# Patient Record
Sex: Female | Born: 1985 | Race: White | Hispanic: No | Marital: Married | Smoking: Never smoker
Health system: Southern US, Community
[De-identification: ages and names within clinical notes are randomized; demographics above are authoritative.]

## PROBLEM LIST (undated history)

## (undated) DIAGNOSIS — R519 Headache, unspecified: Secondary | ICD-10-CM

## (undated) DIAGNOSIS — K8020 Calculus of gallbladder without cholecystitis without obstruction: Secondary | ICD-10-CM

## (undated) DIAGNOSIS — J45909 Unspecified asthma, uncomplicated: Secondary | ICD-10-CM

## (undated) DIAGNOSIS — D649 Anemia, unspecified: Secondary | ICD-10-CM

## (undated) DIAGNOSIS — R51 Headache: Secondary | ICD-10-CM

## (undated) DIAGNOSIS — F32A Depression, unspecified: Secondary | ICD-10-CM

## (undated) DIAGNOSIS — I499 Cardiac arrhythmia, unspecified: Secondary | ICD-10-CM

## (undated) DIAGNOSIS — K219 Gastro-esophageal reflux disease without esophagitis: Secondary | ICD-10-CM

## (undated) DIAGNOSIS — F419 Anxiety disorder, unspecified: Secondary | ICD-10-CM

## (undated) HISTORY — PX: TONSILLECTOMY: SUR1361

## (undated) HISTORY — PX: MANDIBLE SURGERY: SHX707

## (undated) HISTORY — DX: Depression, unspecified: F32.A

---
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
## 2006-08-17 ENCOUNTER — Emergency Department: Payer: Self-pay | Admitting: Emergency Medicine

## 2006-08-19 ENCOUNTER — Ambulatory Visit: Payer: Self-pay | Admitting: Emergency Medicine

## 2006-08-20 ENCOUNTER — Emergency Department: Payer: Self-pay | Admitting: Emergency Medicine

## 2007-02-14 ENCOUNTER — Emergency Department: Payer: Self-pay | Admitting: Emergency Medicine

## 2007-07-12 ENCOUNTER — Emergency Department: Payer: Self-pay | Admitting: Emergency Medicine

## 2008-01-19 ENCOUNTER — Observation Stay: Payer: Self-pay

## 2008-01-23 ENCOUNTER — Observation Stay: Payer: Self-pay | Admitting: Obstetrics and Gynecology

## 2008-01-27 ENCOUNTER — Inpatient Hospital Stay: Payer: Self-pay | Admitting: Obstetrics and Gynecology

## 2008-03-01 ENCOUNTER — Emergency Department: Payer: Self-pay | Admitting: Emergency Medicine

## 2008-11-24 ENCOUNTER — Ambulatory Visit: Payer: Self-pay | Admitting: Obstetrics and Gynecology

## 2009-02-02 ENCOUNTER — Observation Stay: Payer: Self-pay | Admitting: Obstetrics and Gynecology

## 2009-03-09 ENCOUNTER — Observation Stay: Payer: Self-pay | Admitting: Obstetrics and Gynecology

## 2009-03-16 ENCOUNTER — Observation Stay: Payer: Self-pay | Admitting: Obstetrics and Gynecology

## 2009-03-17 ENCOUNTER — Observation Stay: Payer: Self-pay | Admitting: Obstetrics and Gynecology

## 2009-03-19 ENCOUNTER — Observation Stay: Payer: Self-pay

## 2009-03-23 ENCOUNTER — Observation Stay: Payer: Self-pay | Admitting: Obstetrics and Gynecology

## 2009-03-31 ENCOUNTER — Inpatient Hospital Stay: Payer: Self-pay | Admitting: Obstetrics and Gynecology

## 2009-04-08 ENCOUNTER — Emergency Department: Payer: Self-pay | Admitting: Emergency Medicine

## 2010-01-17 IMAGING — CT CT HEAD WITHOUT CONTRAST
2 series · 16 of 30 positions shown, 20 images · non-contrast
Comparison: none

REASON FOR EXAM: headache
COMMENTS:

[Series 2: without · axial · non-contrast · 0.39mm/px · z∈[+496,+616]mm · 13 of 28 slices shown, 17 images]
[im 2/28  brain]
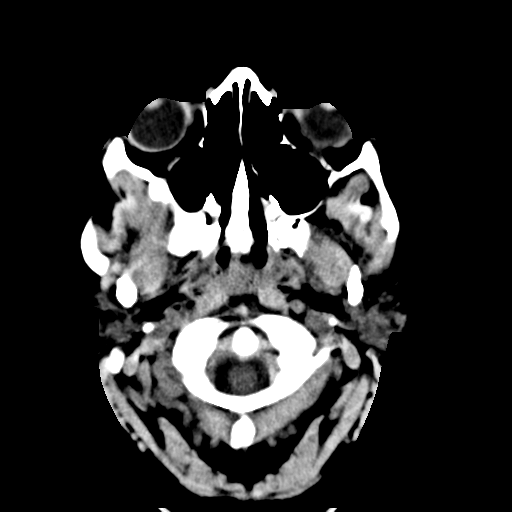
[im 2/28  bone]
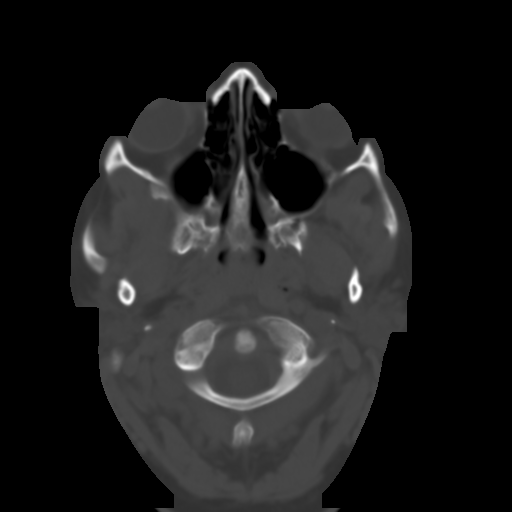
[im 4/28  brain]
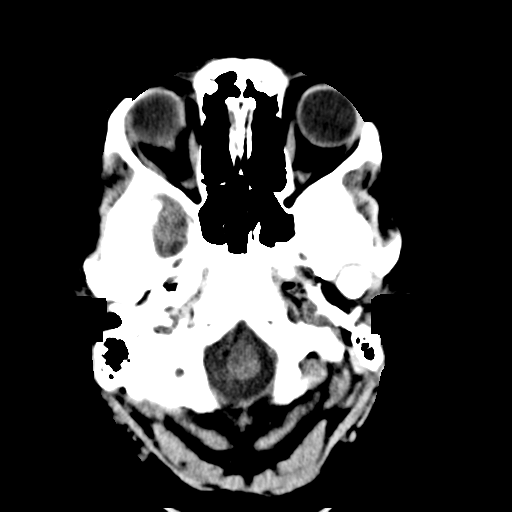
[im 6/28  brain]
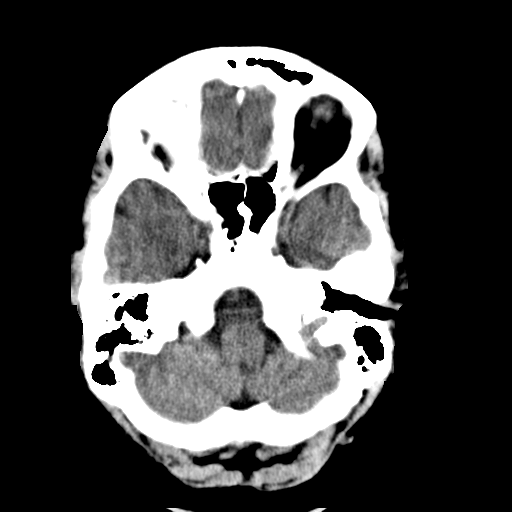
[im 8/28  brain]
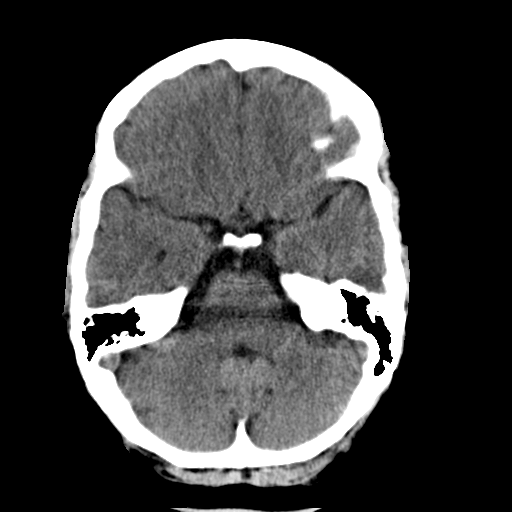
[im 10/28  brain]
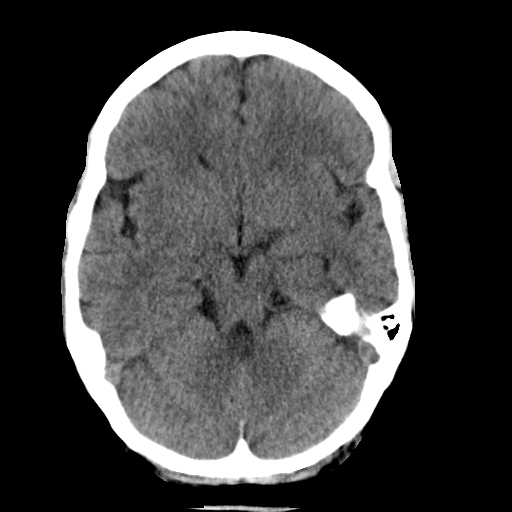
[im 10/28  bone]
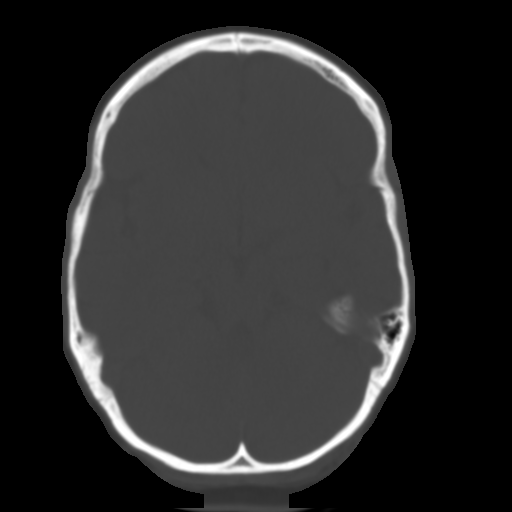
[im 12/28  brain]
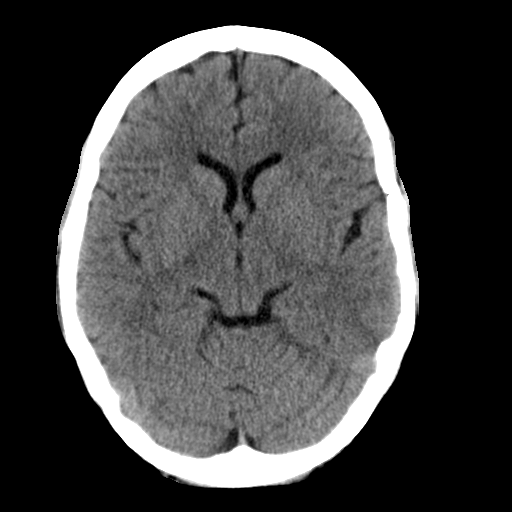
[im 14/28  brain]
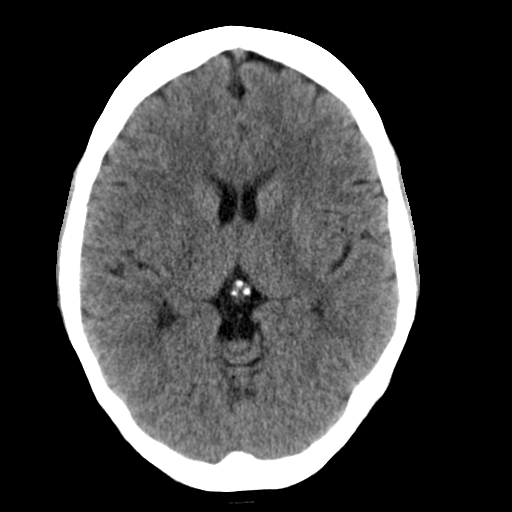
[im 16/28  brain]
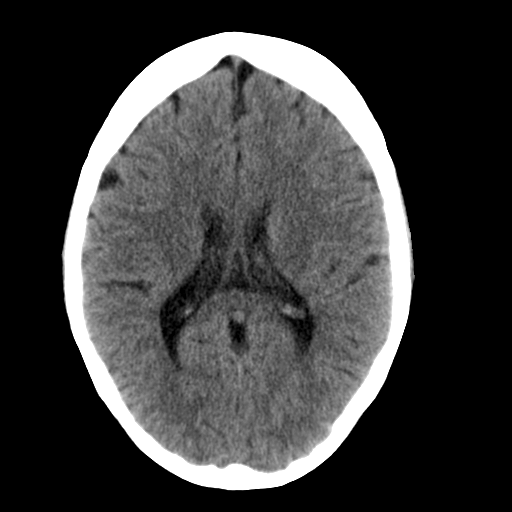
[im 18/28  brain]
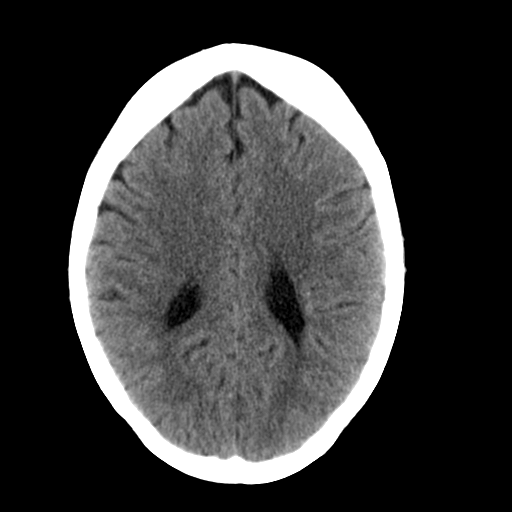
[im 18/28  bone]
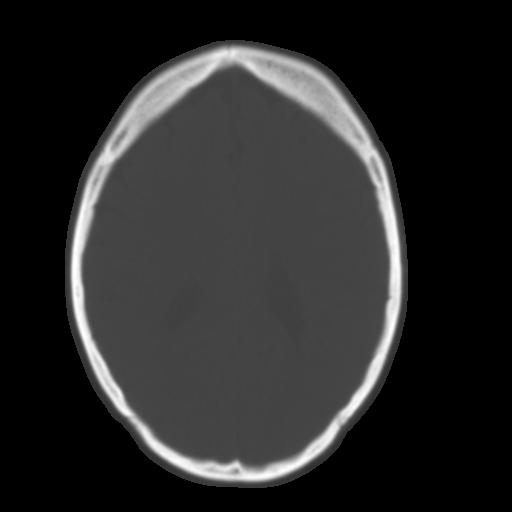
[im 20/28  brain]
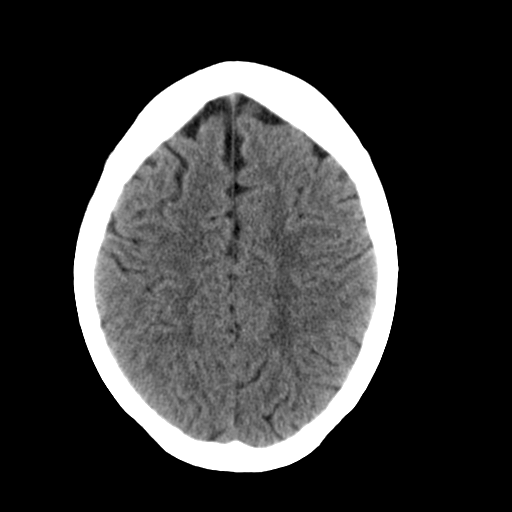
[im 22/28  brain]
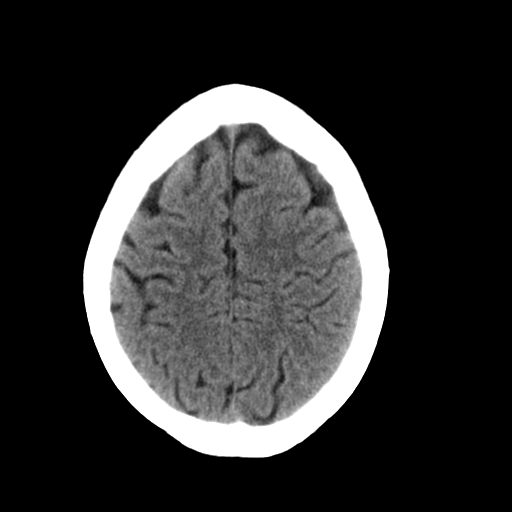
[im 24/28  brain]
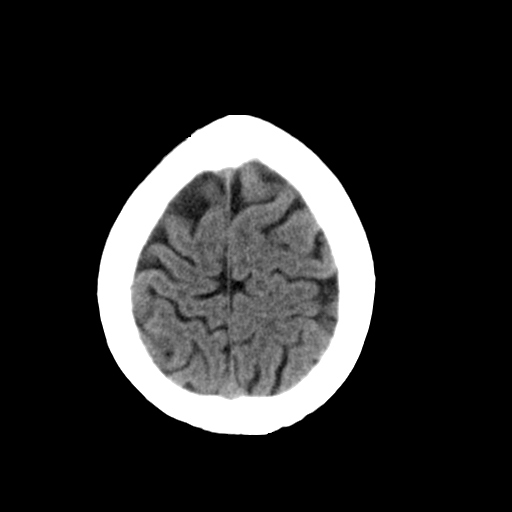
[im 26/28  brain]
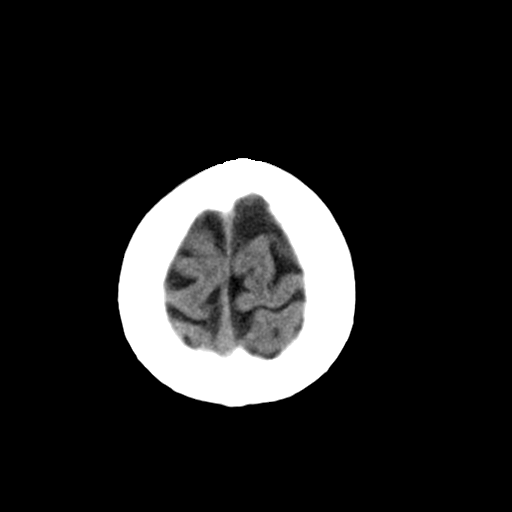
[im 26/28  bone]
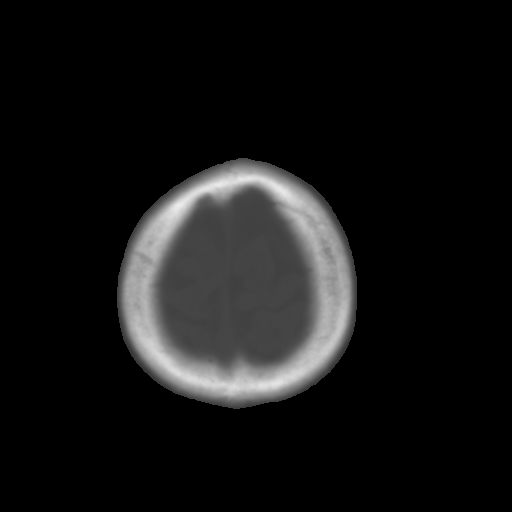

[Series 3: bone · axial · 0.39mm/px · z∈[+496,+536]mm · 3 of 28 slices shown]
[im 2/28  bone]
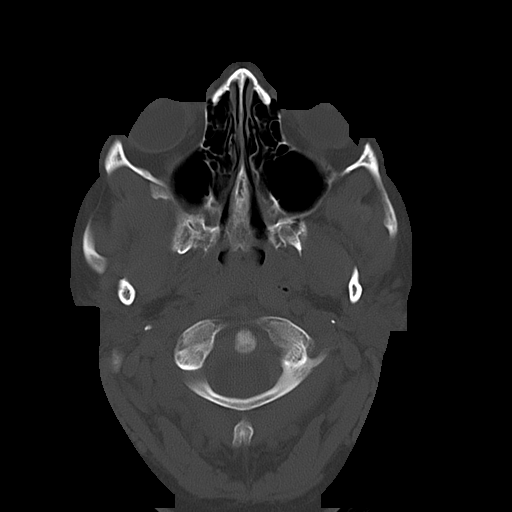
[im 6/28  bone]
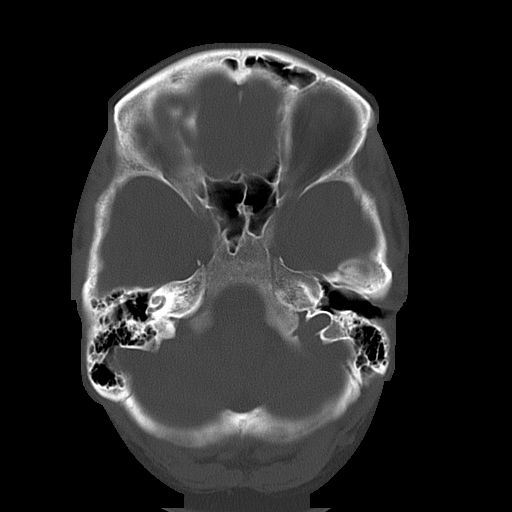
[im 10/28  bone]
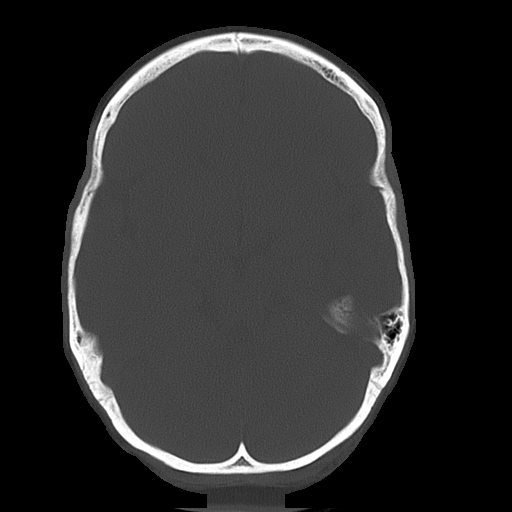

[16 of 30 positions shown; findings below may reference images not displayed]

PROCEDURE:     CT  - CT HEAD WITHOUT CONTRAST  - April 08, 2009  [DATE]

RESULT:     History: Headache, recent epidural.

Comparison studies: No recent.

Procedure and Findings: Standard nonenhanced head CT obtained. No
intra-axial or extra-axial pathologic fluid or blood collections identified.
No mass lesion noted. No hydrocephalus noted, lucencies noted in the left
temporal region appear to represent prominent sulci. If symptoms persist MRI
is suggested. No acute bony abnormality identified.
IMPRESSION: No acute abnormality identified. Please see discussion
above.

## 2011-11-20 ENCOUNTER — Observation Stay: Payer: Self-pay | Admitting: Obstetrics and Gynecology

## 2011-11-20 LAB — URINALYSIS, COMPLETE
Bilirubin,UR: NEGATIVE
Blood: NEGATIVE
Glucose,UR: NEGATIVE mg/dL (ref 0–75)
Hyaline Cast: 2
Ketone: NEGATIVE
Nitrite: NEGATIVE
Ph: 6 (ref 4.5–8.0)
Protein: NEGATIVE
RBC,UR: 2 /HPF (ref 0–5)
Specific Gravity: 1.016 (ref 1.003–1.030)
Squamous Epithelial: 14
WBC UR: 9 /HPF (ref 0–5)

## 2011-11-20 LAB — FETAL FIBRONECTIN
Appearance: NORMAL
Fetal Fibronectin: NEGATIVE

## 2011-11-30 ENCOUNTER — Inpatient Hospital Stay: Payer: Self-pay

## 2011-11-30 LAB — CBC WITH DIFFERENTIAL/PLATELET
Basophil #: 0 10*3/uL (ref 0.0–0.1)
Basophil %: 0.2 %
Eosinophil #: 0.1 10*3/uL (ref 0.0–0.7)
Eosinophil %: 1.1 %
HCT: 30.9 % — ABNORMAL LOW (ref 35.0–47.0)
HGB: 10 g/dL — ABNORMAL LOW (ref 12.0–16.0)
Lymphocyte #: 3 10*3/uL (ref 1.0–3.6)
Lymphocyte %: 23.5 %
MCH: 22.6 pg — ABNORMAL LOW (ref 26.0–34.0)
MCHC: 32.5 g/dL (ref 32.0–36.0)
MCV: 70 fL — ABNORMAL LOW (ref 80–100)
Monocyte #: 1.1 10*3/uL — ABNORMAL HIGH (ref 0.0–0.7)
Monocyte %: 8.6 %
Neutrophil #: 8.4 10*3/uL — ABNORMAL HIGH (ref 1.4–6.5)
Neutrophil %: 66.6 %
Platelet: 264 10*3/uL (ref 150–440)
RBC: 4.43 10*6/uL (ref 3.80–5.20)
RDW: 16.3 % — ABNORMAL HIGH (ref 11.5–14.5)
WBC: 12.6 10*3/uL — ABNORMAL HIGH (ref 3.6–11.0)

## 2011-12-02 LAB — HEMATOCRIT: HCT: 25.8 % — ABNORMAL LOW (ref 35.0–47.0)

## 2011-12-07 ENCOUNTER — Emergency Department: Payer: Self-pay | Admitting: Emergency Medicine

## 2011-12-07 LAB — COMPREHENSIVE METABOLIC PANEL
Albumin: 3.2 g/dL — ABNORMAL LOW (ref 3.4–5.0)
Alkaline Phosphatase: 90 U/L (ref 50–136)
Anion Gap: 10 (ref 7–16)
BUN: 8 mg/dL (ref 7–18)
Bilirubin,Total: 0.3 mg/dL (ref 0.2–1.0)
Calcium, Total: 9 mg/dL (ref 8.5–10.1)
Chloride: 108 mmol/L — ABNORMAL HIGH (ref 98–107)
Co2: 25 mmol/L (ref 21–32)
Creatinine: 0.66 mg/dL (ref 0.60–1.30)
EGFR (African American): >60
EGFR (Non-African Amer.): >60
Glucose: 83 mg/dL (ref 65–99)
Osmolality: 282 (ref 275–301)
Potassium: 3.8 mmol/L (ref 3.5–5.1)
SGOT(AST): 16 U/L (ref 15–37)
SGPT (ALT): 24 U/L
Sodium: 143 mmol/L (ref 136–145)
Total Protein: 7.6 g/dL (ref 6.4–8.2)

## 2011-12-07 LAB — CBC
HCT: 35.2 % (ref 35.0–47.0)
HGB: 11.2 g/dL — ABNORMAL LOW (ref 12.0–16.0)
MCH: 23 pg — ABNORMAL LOW (ref 26.0–34.0)
MCHC: 31.9 g/dL — ABNORMAL LOW (ref 32.0–36.0)
MCV: 72 fL — ABNORMAL LOW (ref 80–100)
Platelet: 308 10*3/uL (ref 150–440)
RBC: 4.89 10*6/uL (ref 3.80–5.20)
RDW: 18.1 % — ABNORMAL HIGH (ref 11.5–14.5)
WBC: 9.6 10*3/uL (ref 3.6–11.0)

## 2011-12-07 LAB — CK TOTAL AND CKMB (NOT AT ARMC)
CK, Total: 33 U/L (ref 21–215)
CK-MB: <0.5 ng/mL — ABNORMAL LOW (ref 0.5–3.6)

## 2011-12-07 LAB — TROPONIN I: Troponin-I: <0.02 ng/mL

## 2012-01-08 ENCOUNTER — Emergency Department: Payer: Self-pay | Admitting: Emergency Medicine

## 2012-01-08 LAB — URINALYSIS, COMPLETE
Bacteria: NONE SEEN
Bilirubin,UR: NEGATIVE
Glucose,UR: NEGATIVE mg/dL (ref 0–75)
Nitrite: NEGATIVE
Ph: 7 (ref 4.5–8.0)
Protein: NEGATIVE
RBC,UR: 1 /HPF (ref 0–5)
Specific Gravity: 1.014 (ref 1.003–1.030)
Squamous Epithelial: 12
WBC UR: 25 /HPF (ref 0–5)

## 2012-09-16 IMAGING — CR DG CHEST 2V
1 series · 2 of 2 positions shown · non-contrast
Comparison: none

REASON FOR EXAM: Shortness of Breath
COMMENTS:   May transport without cardiac monitor

PROCEDURE:     DXR - DXR CHEST PA (OR AP) AND LATERAL  - December 07, 2011 [DATE]
RESULT:     The lungs are clear. The heart and pulmonary vessels are normal.
The bony and mediastinal structures are unremarkable. There is no effusion.
There is no pneumothorax or evidence of congestive failure.

[Series 1: w chest pa · 0.14mm/px · 2 of 2 slices shown]
[im 1/2]
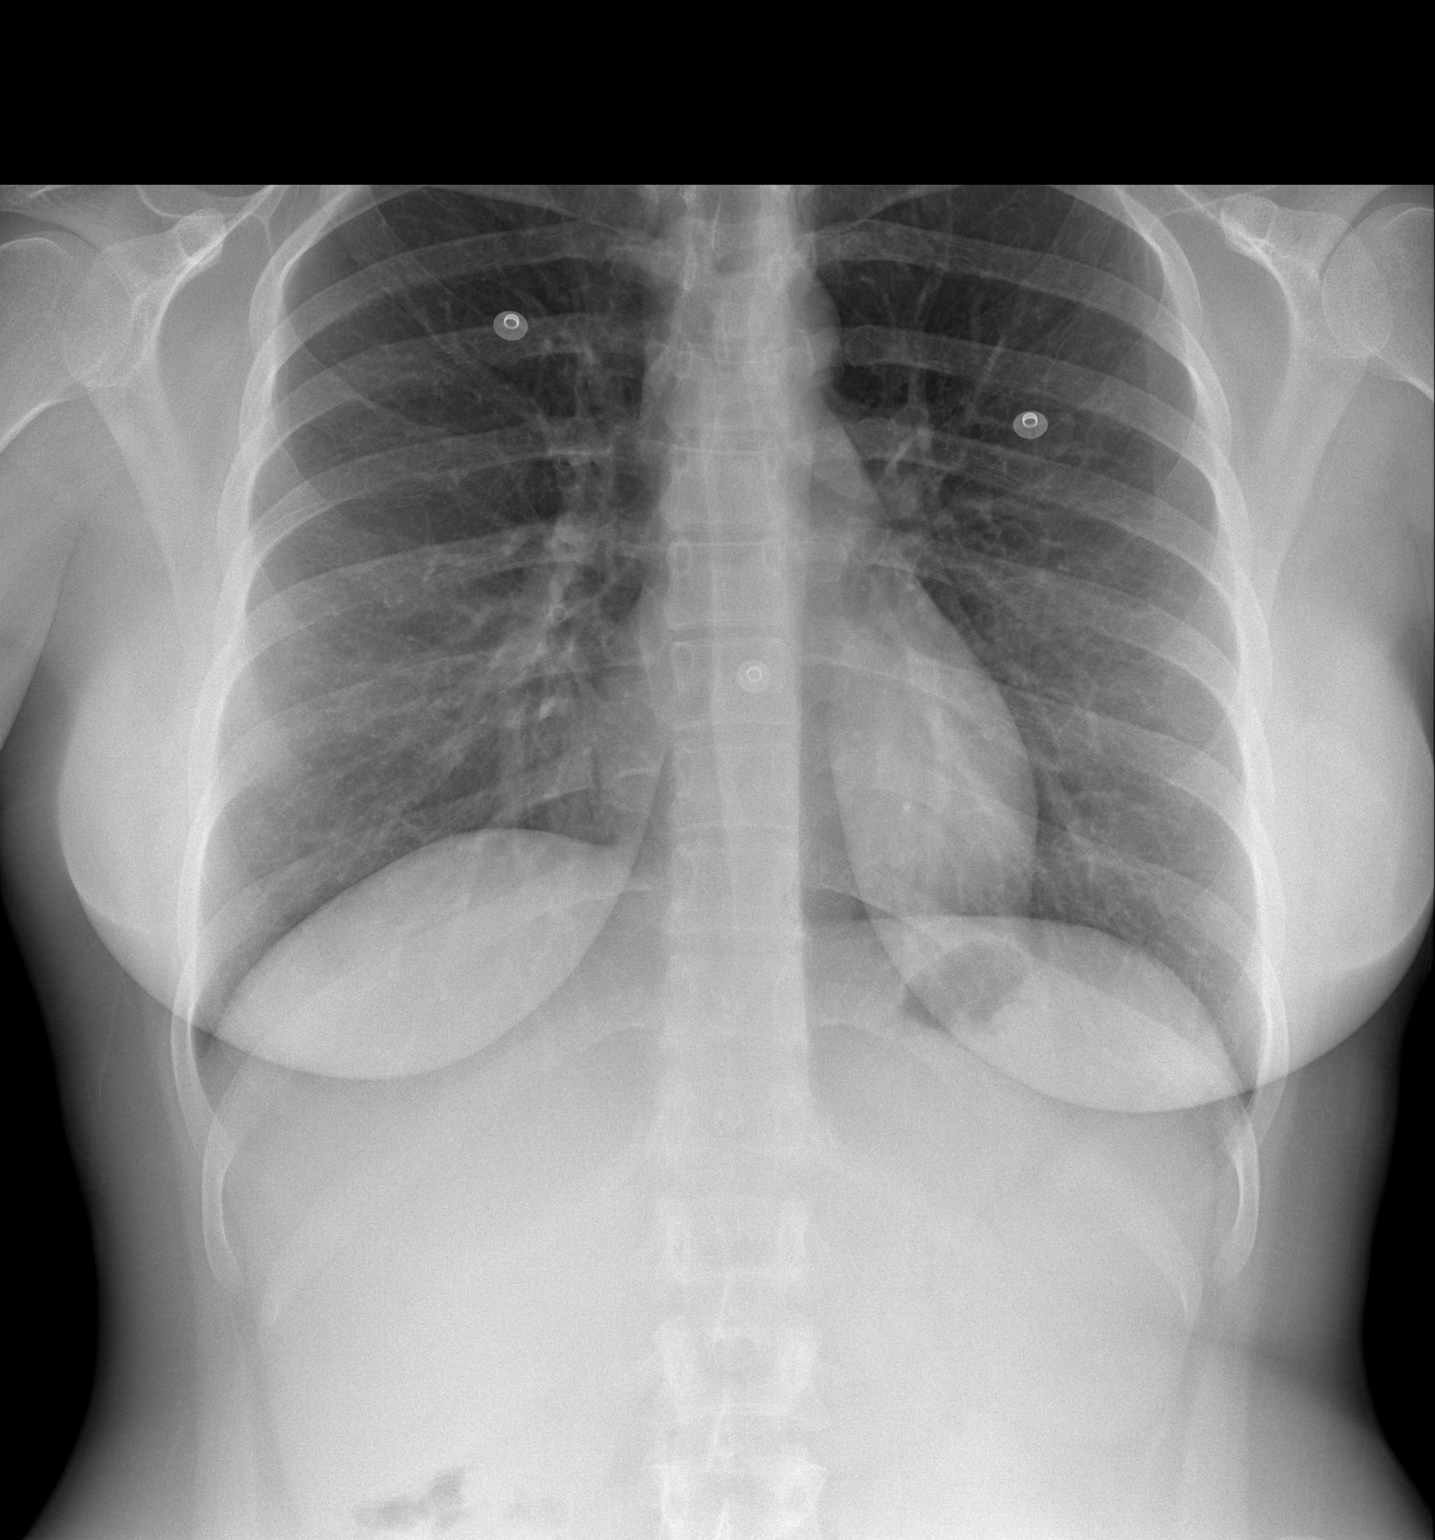
[im 2/2]
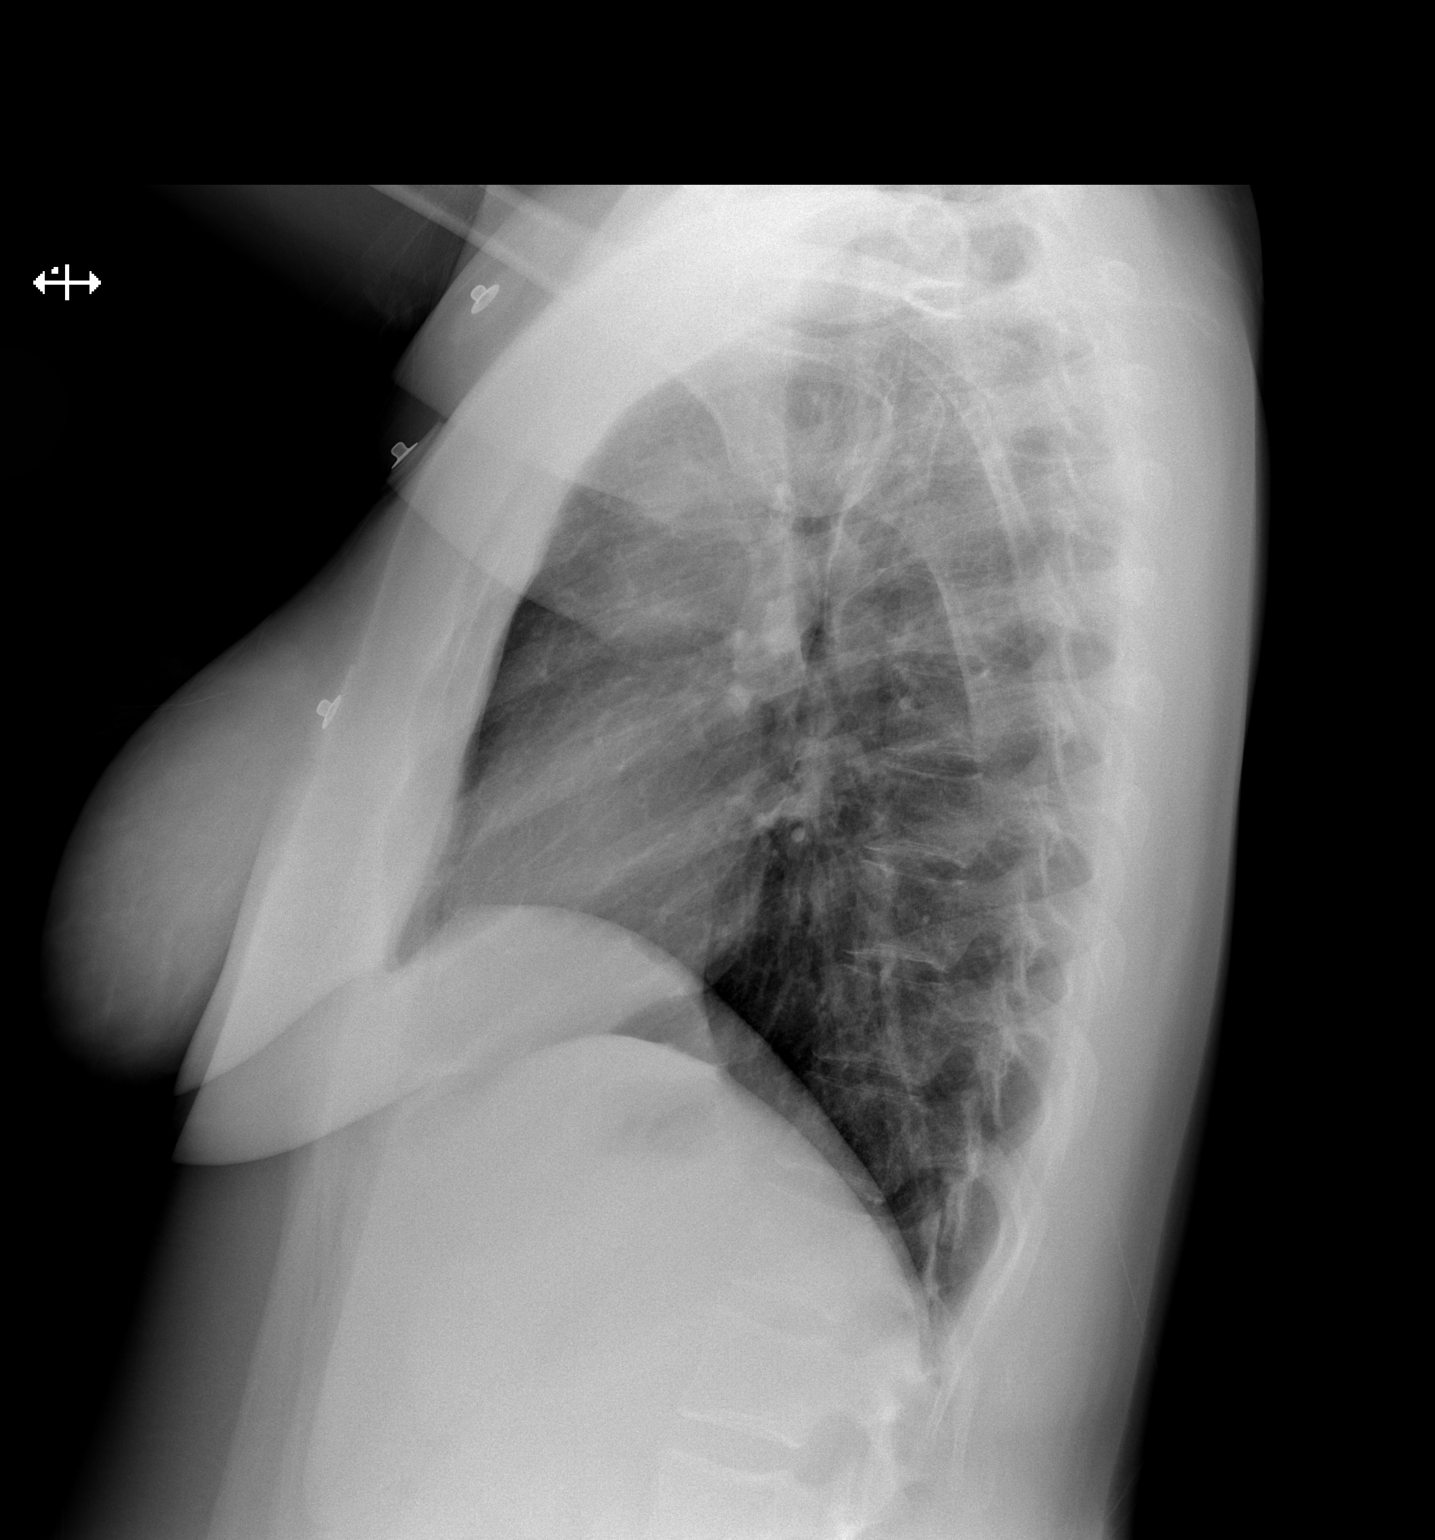

[2 of 2 positions shown; findings below may reference images not displayed]

IMPRESSION: No acute cardiopulmonary disease.

## 2012-09-16 IMAGING — CT CT CHEST W/ CM
1 series · 16 of 34 positions shown, 20 images · IV contrast (APPLIED)
Comparison: none

REASON FOR EXAM: dyspnea post partum
COMMENTS:

PROCEDURE:     CT  - CT CHEST (FOR PE) W  - December 07, 2011  [DATE]
RESULT:     Chest CT dated 12/07/2011.
TECHNIQUE: Helical 3 mm sections were obtained the thoracic inlet the lung
bases status post intravenous ministration of 100 mL misread 370.

[Series 5: soft tissue · axial · 0.69mm/px · z∈[-350,-76]mm · 16 of 103 slices shown, 20 images]
[im 8/103  mediastinal]
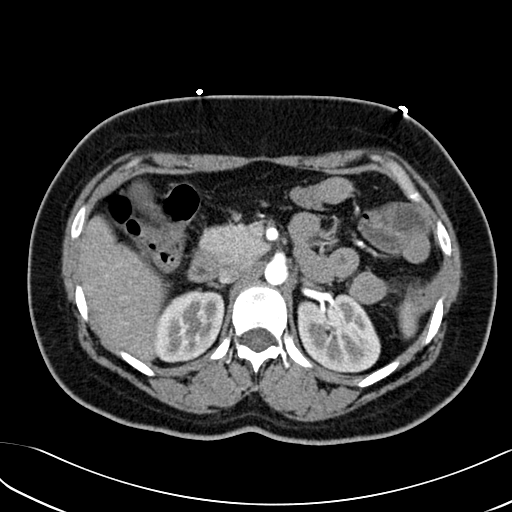
[im 8/103  lung]
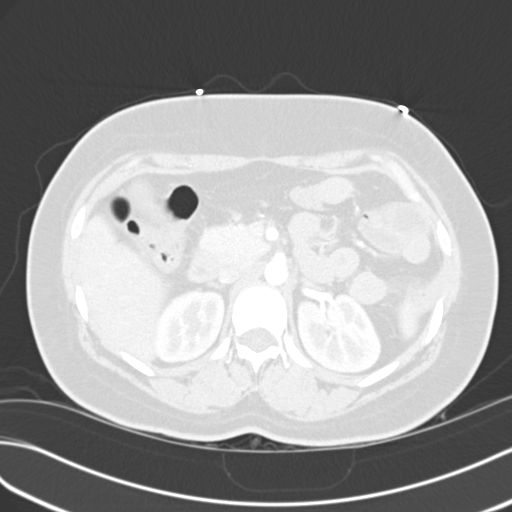
[im 16/103  lung]
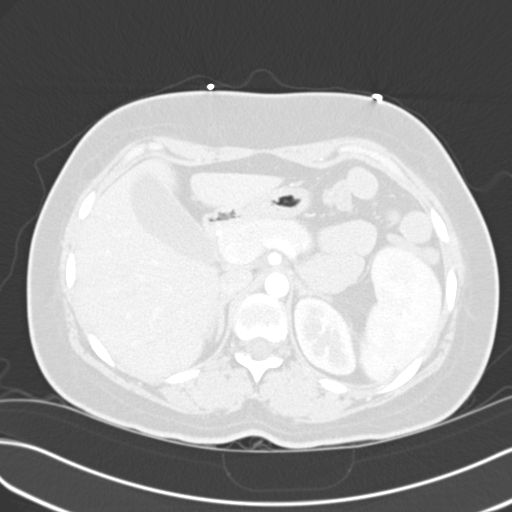
[im 21/103  lung]
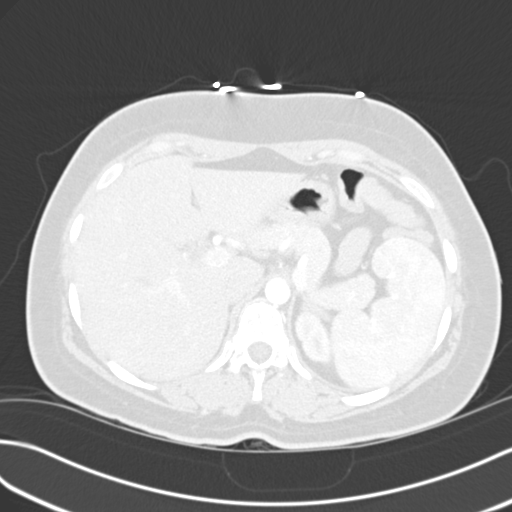
[im 27/103  lung]
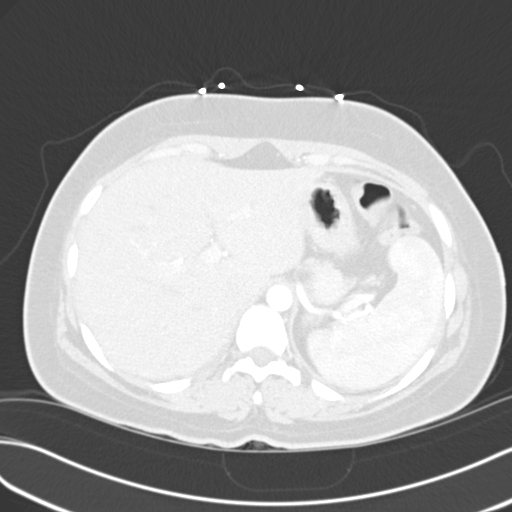
[im 35/103  mediastinal]
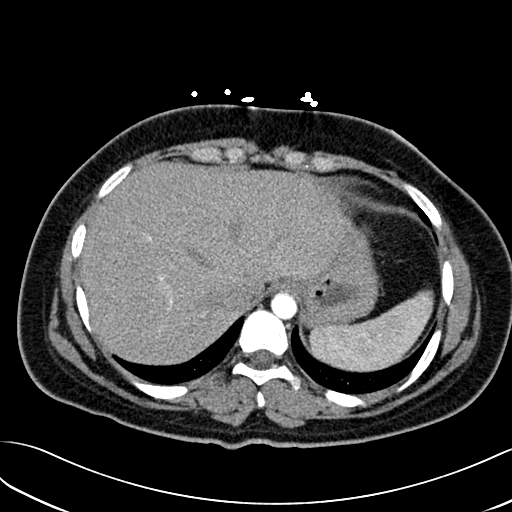
[im 35/103  lung]
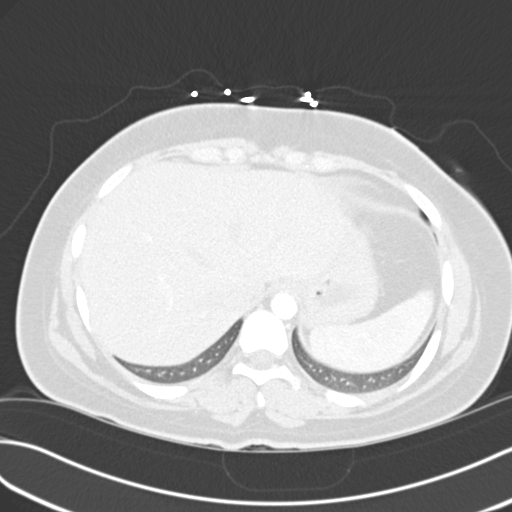
[im 41/103  lung]
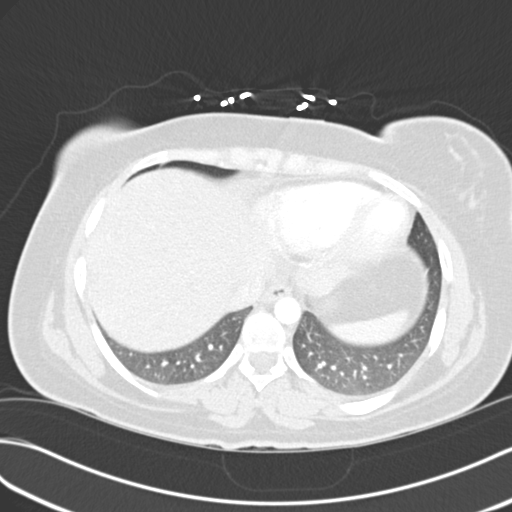
[im 46/103  lung]
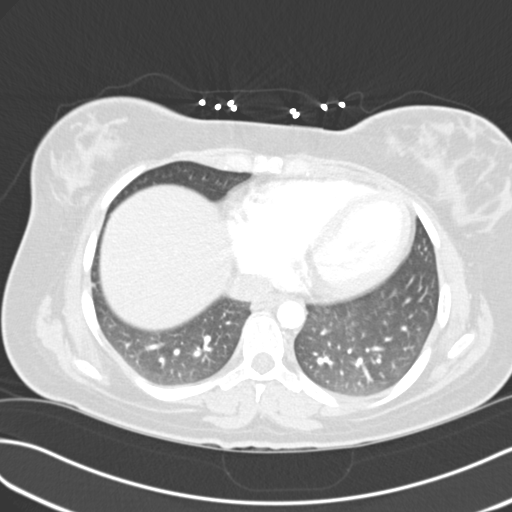
[im 50/103  lung]
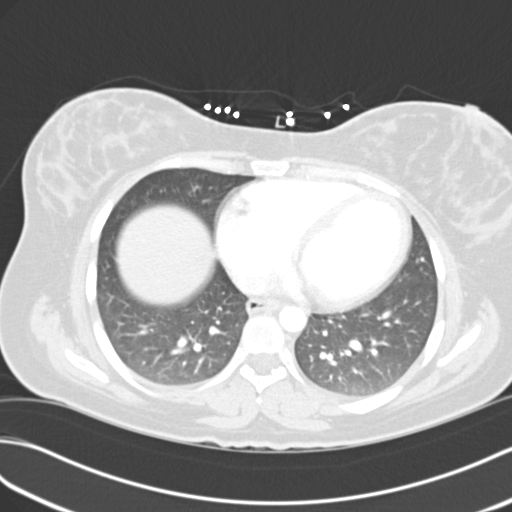
[im 55/103  mediastinal]
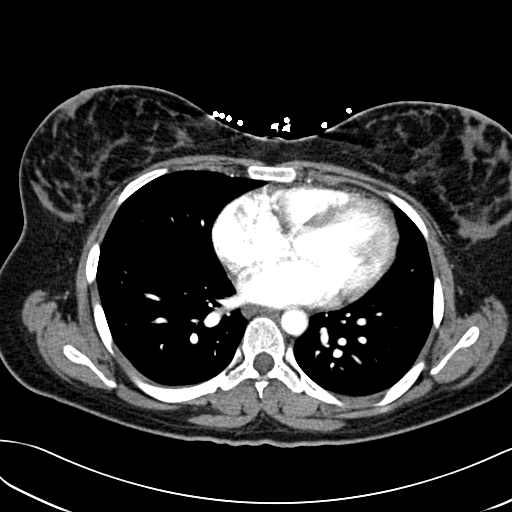
[im 55/103  lung]
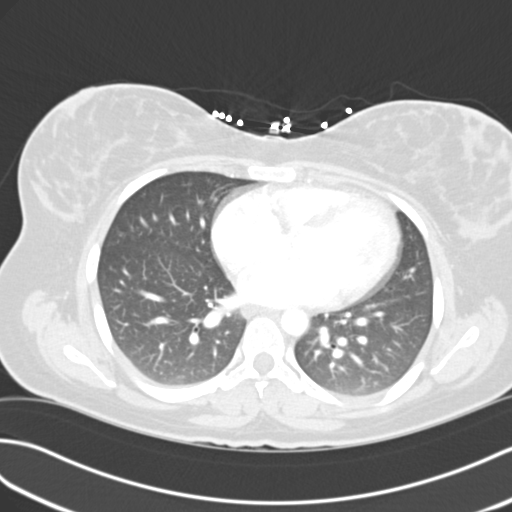
[im 61/103  lung]
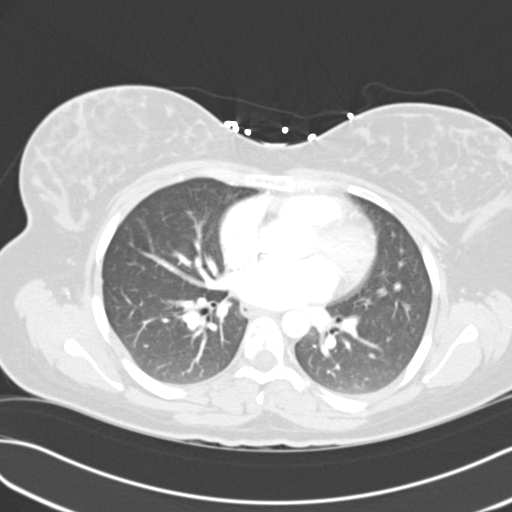
[im 65/103  lung]
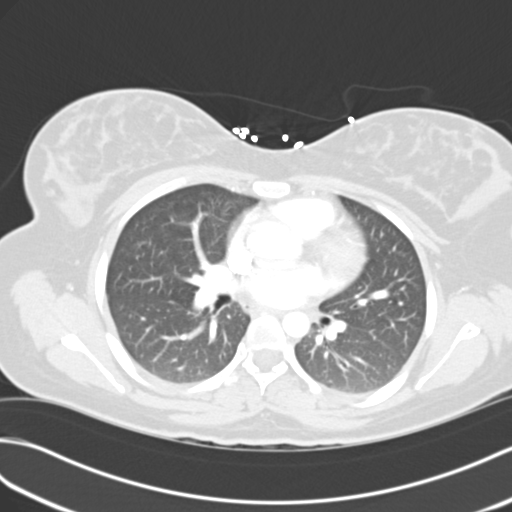
[im 72/103  lung]
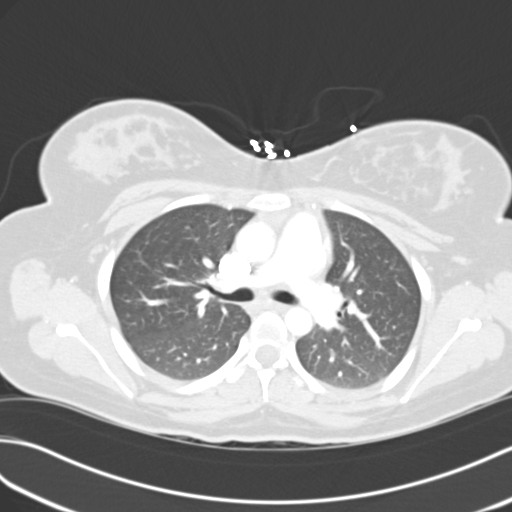
[im 80/103  mediastinal]
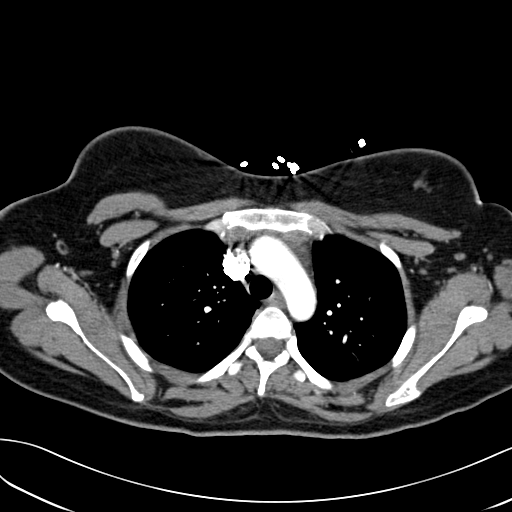
[im 80/103  lung]
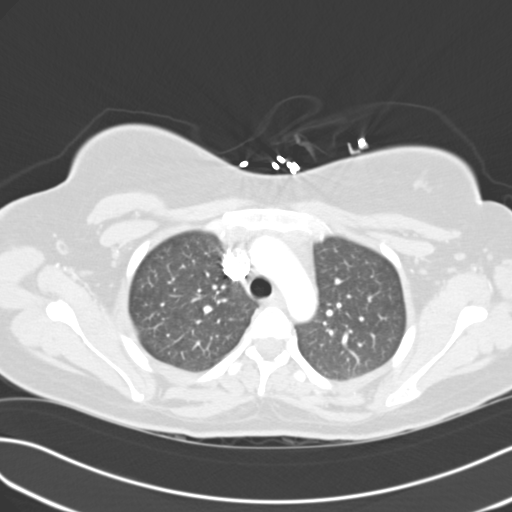
[im 84/103  lung]
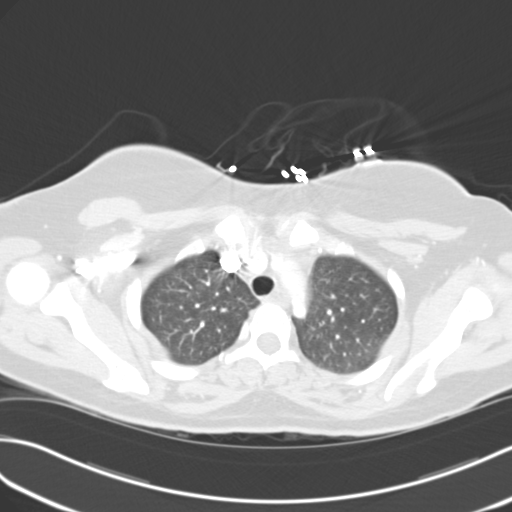
[im 91/103  lung]
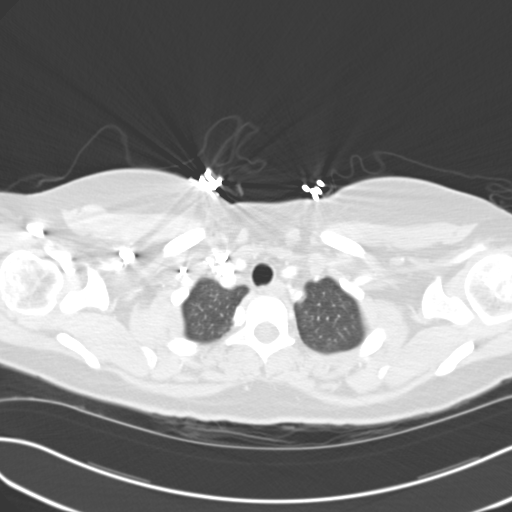
[im 99/103  lung]
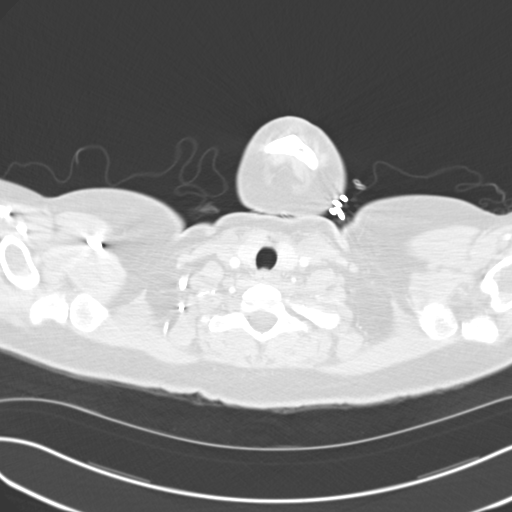

[16 of 34 positions shown; findings below may reference images not displayed]

FINDINGS: Evaluation of the mediastinum and hilar regions and structures
demonstrates no evidence of mediastinal or hilar adenopathy nor masses.
There is no evidence of filling defects within the main, lobar, or segmental
pulmonary arteries. The lung parenchyma demonstrates no evidence of focal
infiltrates, effusions, edema, masses, or nodules. The visualized upper
abdominal viscera demonstrate no gross abnormalities.
IMPRESSION: Chest CT without evidence of focal acute abnormalities.
2. There is no CT evidence of pulmonary arterial embolic disease.

## 2012-09-24 ENCOUNTER — Emergency Department: Payer: Self-pay | Admitting: Unknown Physician Specialty

## 2012-09-24 LAB — URINALYSIS, COMPLETE
Bilirubin,UR: NEGATIVE
Blood: NEGATIVE
Glucose,UR: NEGATIVE mg/dL (ref 0–75)
Ketone: NEGATIVE
Nitrite: NEGATIVE
Ph: 5 (ref 4.5–8.0)
Protein: NEGATIVE
RBC,UR: 2 /HPF (ref 0–5)
Specific Gravity: 1.03 (ref 1.003–1.030)
Squamous Epithelial: 32
WBC UR: 5 /HPF (ref 0–5)

## 2012-09-24 LAB — WET PREP, GENITAL

## 2013-06-29 ENCOUNTER — Emergency Department: Payer: Self-pay | Admitting: Unknown Physician Specialty

## 2014-02-22 ENCOUNTER — Emergency Department: Payer: Self-pay | Admitting: Emergency Medicine

## 2015-03-15 NOTE — H&P (Signed)
L&D Evaluation:  History:   HPI 29 y/o [id number] [facility]EDC 01/02/12 [redacted]W[redacted]D    Presents with contractions    Patient's Medical History No Chronic Illness    Patient's Surgical History none    Medications Pre Natal Vitamins    Allergies NKDA    Social History none    Family History Non-Contributory   ROS:   ROS All systems were reviewed.  HEENT, CNS, GI, GU, Respiratory, CV, Renal and Musculoskeletal systems were found to be normal.   Exam:   Vital Signs stable    General no apparent distress    Abdomen gravid, non-tender    Estimated Fetal Weight Average for gestational age    Back no CVAT    Edema no edema    Reflexes 1+    Pelvic cervix closed and thick, post  No presenting part in pelvis    Mebranes Intact    FHT normal rate with no decels    Ucx regular    Ucx Frequency 3 min    Ucx Pain Scale 2   Impression:   Impression contractions   Plan:   Plan EFM/NST, fluids    Comments fFN sent -- pt had sex last pm; may increase false + rate Contractions improving with IVF bolus If fFN -, will d/c home with F/U 1 week If fFN+, [facility] (given likelihood of false +)   Electronic Signatures: Margaretha GlassingEvans, Ricky L (MD)  (Signed 15-Jan-13 14:00)  Authored: L&D Evaluation   Last Updated: 15-Jan-13 14:00 by Margaretha GlassingEvans, Ricky L (MD)

## 2015-03-15 NOTE — H&P (Signed)
L&D Evaluation:  History:   HPI 29 y/o [id number] @ 36wks EDC 01/02/12 arrives with c/o contractions beginning this am, small bloody show, denies leaking fluid, baby is active. Care @ [facility] well pregnancy, HX rapid deliveries 37/38wks, anemia hgb 10/07/11-10.0.GBS unknown.    Presents with contractions    Patient's Medical History No Chronic Illness    Patient's Surgical History none    Medications Pre Natal Vitamins    Allergies NKDA    Social History none    Family History Non-Contributory   ROS:   ROS All systems were reviewed.  HEENT, CNS, GI, GU, Respiratory, CV, Renal and Musculoskeletal systems were found to be normal.   Exam:   Vital Signs stable    Urine Protein not completed    General no apparent distress    Mental Status clear    Chest clear    Heart normal sinus rhythm    Abdomen gravid, non-tender    Estimated Fetal Weight Average for gestational age    Fetal Position vtx    Fundal Height appropriate    Back no CVAT    Reflexes 1+    Clonus negative    Pelvic no external lesions, 3cm 50% bloody show BBOW vtx @ -2    Mebranes Intact    FHT normal rate with no decels, 120's baseline avg variability with accels    FHT Description 126    Ucx regular, q 2/3 mins 45/60 sec moderate    Skin dry    Lymph no lymphadenopathy   Impression:   Impression early labor, PTL   Plan:   Comments Will begin IV fluid bolus 500cc then 250cc/hr IV ABX begun. Breathing through [facility] well, may request epidural with progress. Knows what to expect 3rd baby. Discussed preterm status delivery at 36 weeks.FOB supportive at bedside.   Electronic Signatures: Albertina ParrLugiano, Adriyanna Christians B (CNM)  (Signed 25-Jan-13 14:20)  Authored: L&D Evaluation   Last Updated: 25-Jan-13 14:20 by Albertina ParrLugiano, Danaja Lasota B (CNM)

## 2015-06-27 DIAGNOSIS — K8020 Calculus of gallbladder without cholecystitis without obstruction: Secondary | ICD-10-CM | POA: Diagnosis not present

## 2015-06-27 DIAGNOSIS — R1013 Epigastric pain: Secondary | ICD-10-CM | POA: Diagnosis present

## 2015-06-27 DIAGNOSIS — Z3202 Encounter for pregnancy test, result negative: Secondary | ICD-10-CM | POA: Insufficient documentation

## 2015-06-28 ENCOUNTER — Emergency Department: Payer: BLUE CROSS/BLUE SHIELD

## 2015-06-28 ENCOUNTER — Encounter: Payer: Self-pay | Admitting: Emergency Medicine

## 2015-06-28 ENCOUNTER — Emergency Department
Admission: EM | Admit: 2015-06-28 | Discharge: 2015-06-28 | Disposition: A | Discharge status: Home / Self Care | Payer: BLUE CROSS/BLUE SHIELD | Attending: Emergency Medicine | Admitting: Emergency Medicine

## 2015-06-28 DIAGNOSIS — K8020 Calculus of gallbladder without cholecystitis without obstruction: Secondary | ICD-10-CM

## 2015-06-28 DIAGNOSIS — R1011 Right upper quadrant pain: Secondary | ICD-10-CM

## 2015-06-28 HISTORY — DX: Calculus of gallbladder without cholecystitis without obstruction: K80.20

## 2015-06-28 HISTORY — DX: Anxiety disorder, unspecified: F41.9

## 2015-06-28 LAB — URINALYSIS COMPLETE WITH MICROSCOPIC (ARMC ONLY)
Bacteria, UA: NONE SEEN
Bilirubin Urine: NEGATIVE
Glucose, UA: NEGATIVE mg/dL
Hgb urine dipstick: NEGATIVE
Ketones, ur: NEGATIVE mg/dL
Nitrite: NEGATIVE
Protein, ur: NEGATIVE mg/dL
Specific Gravity, Urine: 1.016 (ref 1.005–1.030)
pH: 6 (ref 5.0–8.0)

## 2015-06-28 LAB — CBC
HCT: 35.5 % (ref 35.0–47.0)
Hemoglobin: 11.5 g/dL — ABNORMAL LOW (ref 12.0–16.0)
MCH: 24.1 pg — ABNORMAL LOW (ref 26.0–34.0)
MCHC: 32.3 g/dL (ref 32.0–36.0)
MCV: 74.5 fL — ABNORMAL LOW (ref 80.0–100.0)
Platelets: 297 10*3/uL (ref 150–440)
RBC: 4.76 MIL/uL (ref 3.80–5.20)
RDW: 14.3 % (ref 11.5–14.5)
WBC: 10.3 10*3/uL (ref 3.6–11.0)

## 2015-06-28 LAB — COMPREHENSIVE METABOLIC PANEL
ALT: 26 U/L (ref 14–54)
AST: 33 U/L (ref 15–41)
Albumin: 4.3 g/dL (ref 3.5–5.0)
Alkaline Phosphatase: 46 U/L (ref 38–126)
Anion gap: 7 (ref 5–15)
BUN: 8 mg/dL (ref 6–20)
CO2: 25 mmol/L (ref 22–32)
Calcium: 10.1 mg/dL (ref 8.9–10.3)
Chloride: 106 mmol/L (ref 101–111)
Creatinine, Ser: 0.54 mg/dL (ref 0.44–1.00)
GFR calc Af Amer: >60 mL/min (ref >60)
GFR calc non Af Amer: >60 mL/min (ref >60)
Glucose, Bld: 111 mg/dL — ABNORMAL HIGH (ref 65–99)
Potassium: 3.5 mmol/L (ref 3.5–5.1)
Sodium: 138 mmol/L (ref 135–145)
Total Bilirubin: 0.1 mg/dL — ABNORMAL LOW (ref 0.3–1.2)
Total Protein: 7.7 g/dL (ref 6.5–8.1)

## 2015-06-28 LAB — LIPASE, BLOOD: Lipase: 54 U/L — ABNORMAL HIGH (ref 22–51)

## 2015-06-28 LAB — POCT PREGNANCY, URINE: Preg Test, Ur: NEGATIVE

## 2015-06-28 MED ORDER — ONDANSETRON 4 MG PO TBDP: 4.0000 mg | ORAL_TABLET | Freq: Three times a day (TID) | ORAL | Status: DC | PRN

## 2015-06-28 MED ORDER — OXYCODONE-ACETAMINOPHEN 5-325 MG PO TABS: 1.0000 | ORAL_TABLET | ORAL | Status: DC | PRN

## 2015-06-28 MED ORDER — GI COCKTAIL ~~LOC~~
30.0000 mL | Freq: Once | ORAL | Status: AC
  Administered 2015-06-28: 30 mL via ORAL
  Filled 2015-06-28: qty 30

## 2015-06-28 NOTE — ED Provider Notes (Signed)
[facility] Emergency Department Provider Note  ____________________________________________  Time seen: 1:30 AM  I have reviewed the triage vital signs and the nursing notes.   HISTORY  Chief Complaint Abdominal Pain     HPI Miranda Gordon is a 29 y.o. female presents with epigastric abdominal pain that radiates to her back/right upper quadrant. Patient denies fever no nausea or vomiting or diarrhea.     Past Medical History  Diagnosis Date  . Anxiety     There are no active problems to display for this patient.   Past Surgical History  Procedure Laterality Date  . Mandible surgery      No current outpatient prescriptions on file.  Allergies Review of patient's allergies indicates no known allergies.  No family history on file.  Social History Social History  Substance Use Topics  . Smoking status: Never Smoker   . Smokeless tobacco: None  . Alcohol Use: No    Review of Systems  Constitutional: Negative for fever. Eyes: Negative for visual changes. ENT: Negative for sore throat. Cardiovascular: Negative for chest pain. Respiratory: Negative for shortness of breath. Gastrointestinal: Positive for abdominal pain, negative for vomiting and diarrhea. Genitourinary: Negative for dysuria. Musculoskeletal: Negative for back pain. Skin: Negative for rash. Neurological: Negative for headaches, focal weakness or numbness.   10-point ROS otherwise negative.  ____________________________________________   PHYSICAL EXAM:  VITAL SIGNS: ED Triage Vitals  Enc Vitals Group     BP 06/28/15 0013 115/101 mmHg     Pulse Rate 06/28/15 0013 92     Resp 06/28/15 0013 20     Temp 06/28/15 0013 98.1 F (36.7 C)     Temp Source 06/28/15 0013 Oral     SpO2 06/28/15 0013 99 %     Weight 06/28/15 0013 150 lb (68.04 kg)     Height 06/28/15 0013  (1.549 m)     Head Cir --      Peak Flow --      Pain Score 06/28/15 0013 7     Pain Loc  --      Pain Edu? --      Excl. in GC? --      Constitutional: Alert and oriented. Well appearing and in no distress. Eyes: Conjunctivae are normal. PERRL. Normal extraocular movements. ENT   Head: Normocephalic and atraumatic.   Nose: No congestion/rhinnorhea.   Mouth/Throat: Mucous membranes are moist.   Neck: No stridor. Cardiovascular: Normal rate, regular rhythm. Normal and symmetric distal pulses are present in all extremities. No murmurs, rubs, or gallops. Respiratory: Normal respiratory effort without tachypnea nor retractions. Breath sounds are clear and equal bilaterally. No wheezes/rales/rhonchi. Gastrointestinal: Right upper quadrant/epigastric pain with palpation. No distention. There is no CVA tenderness. Genitourinary: deferred Musculoskeletal: Nontender with normal range of motion in all extremities. No joint effusions.  No lower extremity tenderness nor edema. Neurologic:  Normal speech and language. No gross focal neurologic deficits are appreciated. Speech is normal.  Skin:  Skin is warm, dry and intact. No rash noted. Psychiatric: Mood and affect are normal. Speech and behavior are normal. Patient exhibits appropriate insight and judgment.  ____________________________________________    LABS (pertinent positives/negatives)  Labs Reviewed  LIPASE, BLOOD - Abnormal; Notable for the following:    Lipase 54 (*)    All other components within normal limits  COMPREHENSIVE METABOLIC PANEL - Abnormal; Notable for the following:    Glucose, Bld 111 (*)    Total Bilirubin 0.1 (*)  All other components within normal limits  CBC - Abnormal; Notable for the following:    Hemoglobin 11.5 (*)    MCV 74.5 (*)    MCH 24.1 (*)    All other components within normal limits  URINALYSIS COMPLETEWITH MICROSCOPIC ([facility] ONLY) - Abnormal; Notable for the following:    Color, Urine YELLOW (*)    APPearance CLEAR (*)    Leukocytes, UA 3+ (*)    Squamous Epithelial  / LPF 6-30 (*)    All other components within normal limits  POC URINE PREG, ED  POCT PREGNANCY, URINE       RADIOLOGY  US Abdomen Limited RUQ (Final result) Result time: 06/28/15 02:30:41   Procedure changed from US Abdomen Limited      Final result by Rad Results In Interface (06/28/15 02:30:41)   Narrative:   CLINICAL DATA: Right upper quadrant and epigastric pain for 2 days.  EXAM: US ABDOMEN LIMITED - RIGHT UPPER QUADRANT  COMPARISON: None.  FINDINGS: Gallbladder:  Cholelithiasis with mobile shadowing stone at the gallbladder neck measuring 1.5 cm. Gallbladder is mildly distended. No wall thickening visualized, wall thickness of 1.7 mm. Sonographic Murphy sign is positive.  Common bile duct:  Diameter: 2.5 mm, normal  Liver:  No focal lesion identified. Within normal limits in parenchymal echogenicity.  IMPRESSION: Mobile gallstone with mild gallbladder distention but no wall thickening. In the setting of positive sonographic Murphy sign, acute cholecystitis is considered. Nuclear medicine hepatobiliary scan could be considered for further evaluation based on clinical concern. No biliary dilatation.   Electronically Signed By: Rubye Oaks M.D. On: 06/28/2015 02:30     INITIAL IMPRESSION / ASSESSMENT AND PLAN / ED COURSE  Pertinent labs & imaging results that were available during my care of the patient were reviewed by me and considered in my medical decision making (see chart for details).    ____________________________________________   FINAL CLINICAL IMPRESSION(S) / ED DIAGNOSES  Final diagnoses:  Calculus of gallbladder without cholecystitis without obstruction      Darci Current, MD 06/28/15 [phone]

## 2015-06-28 NOTE — ED Notes (Signed)
Patient ambulatory to triage with steady gait, without difficulty or distress noted; pt reports mid abd pain radiating into back; denies accomp symptoms, denies hx of same

## 2015-06-28 NOTE — Discharge Instructions (Signed)
Cholelithiasis °Cholelithiasis (also called gallstones) is a form of gallbladder disease in which gallstones form in your gallbladder. The gallbladder is an organ that stores bile made in the liver, which helps digest fats. Gallstones begin as small crystals and slowly grow into stones. Gallstone pain occurs when the gallbladder spasms and a gallstone is blocking the duct. Pain can also occur when a stone passes out of the duct.  °RISK FACTORS °· Being female.   °· Having multiple pregnancies. Health care providers sometimes advise removing diseased gallbladders before future pregnancies.   °· Being obese. °· Eating a diet heavy in fried foods and fat.   °· Being older than 60 years and increasing age.   °· Prolonged use of medicines containing female hormones.   °· Having diabetes mellitus.   °· Rapidly losing weight.   °· Having a family history of gallstones (heredity).   °SYMPTOMS °· Nausea.   °· Vomiting. °· Abdominal pain.   °· Yellowing of the skin (jaundice).   °· Sudden pain. It may persist from several minutes to several hours. °· Fever.   °· Tenderness to the touch.  °In some cases, when gallstones do not move into the bile duct, people have no pain or symptoms. These are called "silent" gallstones.  °TREATMENT °Silent gallstones do not need treatment. In severe cases, emergency surgery may be required. Options for treatment include: °· Surgery to remove the gallbladder. This is the most common treatment. °· Medicines. These do not always work and may take 6-12 months or more to work. °· Shock wave treatment (extracorporeal biliary lithotripsy). In this treatment an ultrasound machine sends shock waves to the gallbladder to break gallstones into smaller pieces that can pass into the intestines or be dissolved by medicine. °HOME CARE INSTRUCTIONS  °· Only take over-the-counter or prescription medicines for pain, discomfort, or fever as directed by your health care provider.   °· Follow a low-fat diet until  seen again by your health care provider. Fat causes the gallbladder to contract, which can result in pain.   °· Follow up with your health care provider as directed. Attacks are almost always recurrent and surgery is usually required for permanent treatment.   °SEEK IMMEDIATE MEDICAL CARE IF:  °· Your pain increases and is not controlled by medicines.   °· You have a fever or persistent symptoms for more than 2-3 days.   °· You have a fever and your symptoms suddenly get worse.   °· You have persistent nausea and vomiting.   °MAKE SURE YOU:  °· Understand these instructions. °· Will watch your condition. °· Will get help right away if you are not doing well or get worse. °Document Released: 10/18/2005 Document Revised: 06/24/2013 Document Reviewed: 04/15/2013 °Exit[employer]® Patient Information ©2015 [employer]. This information is not intended to replace advice given to you by your health care provider. Make sure you discuss any questions you have with your health care provider. ° °

## 2015-06-29 ENCOUNTER — Encounter: Payer: Self-pay | Admitting: Surgery

## 2015-06-29 ENCOUNTER — Ambulatory Visit (INDEPENDENT_AMBULATORY_CARE_PROVIDER_SITE_OTHER): Payer: BLUE CROSS/BLUE SHIELD | Admitting: Surgery

## 2015-06-29 VITALS — BP 124/68 | HR 78 | Temp 98.5°F | Ht 61.0 in | Wt 152.0 lb

## 2015-06-29 DIAGNOSIS — K8050 Calculus of bile duct without cholangitis or cholecystitis without obstruction: Secondary | ICD-10-CM | POA: Insufficient documentation

## 2015-06-29 DIAGNOSIS — K8020 Calculus of gallbladder without cholecystitis without obstruction: Secondary | ICD-10-CM

## 2015-06-29 NOTE — Progress Notes (Signed)
Patient ID: Miranda Gordon, female   DOB: 04/26/1986, 28 y.o.   MRN: [id number]  Chief Complaint  Patient presents with  . Cholelithiasis    surgical consult    HPI Location, Quality, Duration, Severity, Timing, Context, Modifying Factors, Associated Signs and Symptoms.  Miranda Gordon is a 28 y.o. female.  Who is otherwise healthy referred by the emergency room following the sudden onset of right upper quadrant and epigastric abdominal pain following eating pizza associated with nausea but no emesis. The patient had a workup with normal liver function tests and lipase and ultrasound demonstrated a 1.5 cm gallstone which was mobile. No B evidence of biliary ductal dilatation pericholecystic fluid or gallbladder wall thickening. The patient does relate a 3-4 month history of what she describes as heartburn associated with intermittent right upper quadrant abdominal pain following meals.  Past Medical History  Diagnosis Date  . Anxiety   . Cholelithiasis     Past Surgical History  Procedure Laterality Date  . Mandible surgery      Family History  Problem Relation Age of Onset  . Cancer Father     Social History Social History  Substance Use Topics  . Smoking status: Never Smoker   . Smokeless tobacco: Never Used  . Alcohol Use: No    No Known Allergies  Current Outpatient Prescriptions  Medication Sig Dispense Refill  . ondansetron (ZOFRAN ODT) 4 MG disintegrating tablet Take 1 tablet (4 mg total) by mouth every 8 (eight) hours as needed for nausea or vomiting. 20 tablet 0  . oxyCODONE-acetaminophen (ROXICET) 5-325 MG per tablet Take 1 tablet by mouth every 4 (four) hours as needed for severe pain. 20 tablet 0   No current facility-administered medications for this visit.    Blood pressure 124/68, pulse 78, temperature 98.5 F (36.9 C), temperature source Oral, height 5' 1" (1.549 m), weight 152 lb (68.947 kg), last menstrual period 06/13/2015.  Results for orders  placed or performed during the hospital encounter of 06/28/15 (from the past 48 hour(s))  Urinalysis complete, with microscopic ([facility] only)     Status: Abnormal   Collection Time: 06/28/15 12:16 AM  Result Value Ref Range   Color, Urine YELLOW (A) YELLOW   APPearance CLEAR (A) CLEAR   Glucose, UA NEGATIVE NEGATIVE mg/dL   Bilirubin Urine NEGATIVE NEGATIVE   Ketones, ur NEGATIVE NEGATIVE mg/dL   Specific Gravity, Urine 1.016 1.005 - 1.030   Hgb urine dipstick NEGATIVE NEGATIVE   pH 6.0 5.0 - 8.0   Protein, ur NEGATIVE NEGATIVE mg/dL   Nitrite NEGATIVE NEGATIVE   Leukocytes, UA 3+ (A) NEGATIVE   RBC / HPF 0-5 0 - 5 RBC/hpf   WBC, UA 0-5 0 - 5 WBC/hpf   Bacteria, UA NONE SEEN NONE SEEN   Squamous Epithelial / LPF 6-30 (A) NONE SEEN   Mucous PRESENT   Lipase, blood     Status: Abnormal   Collection Time: 06/28/15 12:19 AM  Result Value Ref Range   Lipase 54 (H) 22 - 51 U/L  Comprehensive metabolic panel     Status: Abnormal   Collection Time: 06/28/15 12:19 AM  Result Value Ref Range   Sodium 138 135 - 145 mmol/L   Potassium 3.5 3.5 - 5.1 mmol/L   Chloride 106 101 - 111 mmol/L   CO2 25 22 - 32 mmol/L   Glucose, Bld 111 (H) 65 - 99 mg/dL   BUN 8 6 - 20 mg/dL   Creatinine, Ser 0.54 0.44 -   1.00 mg/dL   Calcium 10.1 8.9 - 10.3 mg/dL   Total Protein 7.7 6.5 - 8.1 g/dL   Albumin 4.3 3.5 - 5.0 g/dL   AST 33 15 - 41 U/L   ALT 26 14 - 54 U/L   Alkaline Phosphatase 46 38 - 126 U/L   Total Bilirubin 0.1 (L) 0.3 - 1.2 mg/dL   GFR calc non Af Amer >60 >60 mL/min   GFR calc Af Amer >60 >60 mL/min    Comment: (NOTE) The eGFR has been calculated using the CKD EPI equation. This calculation has not been validated in all clinical situations. eGFR's persistently <60 mL/min signify possible Chronic Kidney Disease.    Anion gap 7 5 - 15  CBC     Status: Abnormal   Collection Time: 06/28/15 12:19 AM  Result Value Ref Range   WBC 10.3 3.6 - 11.0 K/uL   RBC 4.76 3.80 - 5.20 MIL/uL    Hemoglobin 11.5 (L) 12.0 - 16.0 g/dL   HCT 35.5 35.0 - 47.0 %   MCV 74.5 (L) 80.0 - 100.0 fL   MCH 24.1 (L) 26.0 - 34.0 pg   MCHC 32.3 32.0 - 36.0 g/dL   RDW 14.3 11.5 - 14.5 %   Platelets 297 150 - 440 K/uL  Pregnancy, urine POC     Status: None   Collection Time: 06/28/15 12:27 AM  Result Value Ref Range   Preg Test, Ur NEGATIVE NEGATIVE    Comment:        THE SENSITIVITY OF THIS METHODOLOGY IS >24 mIU/mL    Us Abdomen Limited Ruq  06/28/2015   CLINICAL DATA:  Right upper quadrant and epigastric pain for 2 days.  EXAM: US ABDOMEN LIMITED - RIGHT UPPER QUADRANT  COMPARISON:  None.  FINDINGS: Gallbladder:  Cholelithiasis with mobile shadowing stone at the gallbladder neck measuring 1.5 cm. Gallbladder is mildly distended. No wall thickening visualized, wall thickness of 1.7 mm. Sonographic Murphy sign is positive.  Common bile duct:  Diameter: 2.5 mm, normal  Liver:  No focal lesion identified. Within normal limits in parenchymal echogenicity.  IMPRESSION: Mobile gallstone with mild gallbladder distention but no wall thickening. In the setting of positive sonographic Murphy sign, acute cholecystitis is considered. Nuclear medicine hepatobiliary scan could be considered for further evaluation based on clinical concern. No biliary dilatation.   Electronically Signed   By: Melanie  Ehinger M.D.   On: 06/28/2015 02:30    Review of Systems  Constitutional: Negative for fever, chills, weight loss, malaise/fatigue and diaphoresis.  Gastrointestinal: Positive for heartburn, nausea and abdominal pain. Negative for vomiting, diarrhea, constipation, blood in stool and melena.  Genitourinary: Negative for dysuria.  Musculoskeletal: Negative.   Skin: Negative for itching and rash.  Endo/Heme/Allergies: Negative.   Psychiatric/Behavioral: Negative for depression and suicidal ideas.  All other systems reviewed and are negative.   Physical Exam  Constitutional: She is oriented to person, place, and  time and well-developed, well-nourished, and in no distress. No distress.  HENT:  Head: Normocephalic.  Eyes: Conjunctivae are normal. Pupils are equal, round, and reactive to light.  Neck: Normal range of motion. No thyromegaly present.  Cardiovascular: Normal rate and regular rhythm.   Pulmonary/Chest: Effort normal.  Abdominal: Soft. She exhibits no distension and no mass. There is no tenderness. There is no rebound and no guarding.  Neurological: She is oriented to person, place, and time.  Skin: Skin is warm and dry. She is not diaphoretic.  Psychiatric: Mood, memory, affect and   judgment normal.   Data Reviewed Ultrasound of the right upper quadrant and liver was personally reviewed on the PACS monitor.  I have personally reviewed the patient's imaging, laboratory findings and medical records.    Assessment:    Pleasant 28-year-old otherwise healthy white female with 3 children and a remote history of cholelithiasis presents with recurrent biliary colic and an episode of significant cholecystitis on Monday. She is interested in surgical intervention.    Plan:    We discussed laparoscopic cholecystectomy to be performed a week of September 5. I discussed with her and her husband in detail the procedure how it is done laparoscopically the risk of open operation bile duct injury being 1 in 200.  Discussed possible retained stones, ERCP need.  All questions addressed.          Dhruvan Gullion MD, FACS 06/29/2015, 3:03 PM      

## 2015-07-05 ENCOUNTER — Telehealth: Payer: Self-pay | Admitting: Surgery

## 2015-07-05 NOTE — Telephone Encounter (Signed)
Pt advised of pre op date/time and sx date. Sx: 07/14/15 with Dr Chales Abrahams Chole Pre op: 07/12/15 bw 1-:00pm--Phone.

## 2015-07-11 ENCOUNTER — Other Ambulatory Visit: Payer: BLUE CROSS/BLUE SHIELD

## 2015-07-12 ENCOUNTER — Other Ambulatory Visit: Payer: BLUE CROSS/BLUE SHIELD

## 2015-07-13 ENCOUNTER — Encounter: Payer: Self-pay | Admitting: *Deleted

## 2015-07-13 NOTE — Patient Instructions (Signed)
  Your procedure is scheduled on: 07-14-15 Report to [facility] 2ND FLOOR To find out your arrival time please call [phone] between 1PM - 3PM on 07-13-15  Remember: Instructions that are not followed completely may result in serious medical risk, up to and including death, or upon the discretion of your surgeon and anesthesiologist your surgery may need to be rescheduled.    _X___ 1. Do not eat food or drink liquids after midnight. No gum chewing or hard candies.     _X___ 2. No Alcohol for 24 hours before or after surgery.   ____ 3. Bring all medications with you on the day of surgery if instructed.    ____ 4. Notify your doctor if there is any change in your medical condition     (cold, fever, infections).     Do not wear jewelry, make-up, hairpins, clips or nail polish.  Do not wear lotions, powders, or perfumes. You may wear deodorant.  Do not shave 48 hours prior to surgery. Men may shave face and neck.  Do not bring valuables to the hospital.    [facility] is not responsible for any belongings or valuables.               Contacts, dentures or bridgework may not be worn into surgery.  Leave your suitcase in the car. After surgery it may be brought to your room.  For patients admitted to the hospital, discharge time is determined by your treatment team.   Patients discharged the day of surgery will not be allowed to drive home.   Please read over the following fact sheets that you were given:    ____ Take these medicines the morning of surgery with A SIP OF WATER:    1. NONE  2.   3.   4.  5.  6.  ____ Fleet Enema (as directed)   ____ Use CHG Soap as directed  ____ Use inhalers on the day of surgery  ____ Stop metformin 2 days prior to surgery    ____ Take 1/2 of usual insulin dose the night before surgery and none on the morning of surgery.   ____ Stop Coumadin/Plavix/aspirin-N/A  ____ Stop Anti-inflammatories-NO NSAIDS OR ASA  PRODUCTS-OXYCODONE/TYLENOL OK   ____ Stop supplements until after surgery.    ____ Bring C-Pap to the hospital.

## 2015-07-14 ENCOUNTER — Encounter
Admission: RE | Disposition: A | Discharge status: Home / Self Care | Payer: Self-pay | Source: Ambulatory Visit | Attending: Surgery

## 2015-07-14 ENCOUNTER — Ambulatory Visit: Payer: BLUE CROSS/BLUE SHIELD | Admitting: Anesthesiology

## 2015-07-14 ENCOUNTER — Ambulatory Visit
Admission: RE | Admit: 2015-07-14 | Discharge: 2015-07-14 | Disposition: A | Discharge status: Home / Self Care | Payer: BLUE CROSS/BLUE SHIELD | Source: Ambulatory Visit | Attending: Surgery | Admitting: Surgery

## 2015-07-14 DIAGNOSIS — K8010 Calculus of gallbladder with chronic cholecystitis without obstruction: Secondary | ICD-10-CM | POA: Diagnosis not present

## 2015-07-14 DIAGNOSIS — K8020 Calculus of gallbladder without cholecystitis without obstruction: Secondary | ICD-10-CM

## 2015-07-14 DIAGNOSIS — F419 Anxiety disorder, unspecified: Secondary | ICD-10-CM | POA: Insufficient documentation

## 2015-07-14 HISTORY — DX: Headache: R51

## 2015-07-14 HISTORY — DX: Gastro-esophageal reflux disease without esophagitis: K21.9

## 2015-07-14 HISTORY — PX: CHOLECYSTECTOMY: SHX55

## 2015-07-14 HISTORY — DX: Anemia, unspecified: D64.9

## 2015-07-14 HISTORY — DX: Cardiac arrhythmia, unspecified: I49.9

## 2015-07-14 HISTORY — DX: Unspecified asthma, uncomplicated: J45.909

## 2015-07-14 HISTORY — DX: Headache, unspecified: R51.9

## 2015-07-14 LAB — POCT PREGNANCY, URINE: Preg Test, Ur: NEGATIVE

## 2015-07-14 SURGERY — LAPAROSCOPIC CHOLECYSTECTOMY
Anesthesia: General

## 2015-07-14 MED ORDER — OXYCODONE-ACETAMINOPHEN 5-325 MG PO TABS: 1.0000 | ORAL_TABLET | Freq: Four times a day (QID) | ORAL | Status: DC | PRN

## 2015-07-14 MED ORDER — GLYCOPYRROLATE 0.2 MG/ML IJ SOLN
INTRAMUSCULAR | Status: DC | PRN
  Administered 2015-07-14: 0.6 mg via INTRAVENOUS

## 2015-07-14 MED ORDER — LACTATED RINGERS IV SOLN
INTRAVENOUS | Status: DC
  Administered 2015-07-14 (×3): via INTRAVENOUS

## 2015-07-14 MED ORDER — CEFAZOLIN SODIUM-DEXTROSE 2-3 GM-% IV SOLR
2.0000 g | INTRAVENOUS | Status: AC
  Administered 2015-07-14 (×2): 2 g via INTRAVENOUS

## 2015-07-14 MED ORDER — BUPIVACAINE HCL (PF) 0.25 % IJ SOLN
INTRAMUSCULAR | Status: AC
  Filled 2015-07-14: qty 30

## 2015-07-14 MED ORDER — PROPOFOL 10 MG/ML IV BOLUS
INTRAVENOUS | Status: DC | PRN
  Administered 2015-07-14: 150 mg via INTRAVENOUS

## 2015-07-14 MED ORDER — PROMETHAZINE HCL 25 MG/ML IJ SOLN
6.2500 mg | Freq: Four times a day (QID) | INTRAMUSCULAR | Status: DC | PRN
  Administered 2015-07-14: 6.25 mg via INTRAVENOUS

## 2015-07-14 MED ORDER — EPHEDRINE SULFATE 50 MG/ML IJ SOLN
INTRAMUSCULAR | Status: DC | PRN
  Administered 2015-07-14: 10 mg via INTRAVENOUS

## 2015-07-14 MED ORDER — ONDANSETRON HCL 4 MG/2ML IJ SOLN
INTRAMUSCULAR | Status: DC | PRN
  Administered 2015-07-14: 4 mg via INTRAVENOUS

## 2015-07-14 MED ORDER — LIDOCAINE HCL (CARDIAC) 20 MG/ML IV SOLN
INTRAVENOUS | Status: DC | PRN
  Administered 2015-07-14: 30 mg via INTRAVENOUS

## 2015-07-14 MED ORDER — FAMOTIDINE 20 MG PO TABS
ORAL_TABLET | ORAL | Status: AC
  Administered 2015-07-14: 20 mg via ORAL
  Filled 2015-07-14: qty 1

## 2015-07-14 MED ORDER — MIDAZOLAM HCL 2 MG/2ML IJ SOLN
INTRAMUSCULAR | Status: DC | PRN
  Administered 2015-07-14 (×2): 1 mg via INTRAVENOUS

## 2015-07-14 MED ORDER — BUPIVACAINE HCL 0.25 % IJ SOLN
INTRAMUSCULAR | Status: DC | PRN
  Administered 2015-07-14: 30 mL

## 2015-07-14 MED ORDER — DEXAMETHASONE SODIUM PHOSPHATE 4 MG/ML IJ SOLN
INTRAMUSCULAR | Status: DC | PRN
  Administered 2015-07-14: 5 mg via INTRAVENOUS

## 2015-07-14 MED ORDER — ENOXAPARIN SODIUM 40 MG/0.4ML ~~LOC~~ SOLN
40.0000 mg | Freq: Once | SUBCUTANEOUS | Status: AC
  Administered 2015-07-14: 40 mg via SUBCUTANEOUS
  Filled 2015-07-14: qty 0.4

## 2015-07-14 MED ORDER — CEFAZOLIN SODIUM-DEXTROSE 2-3 GM-% IV SOLR
INTRAVENOUS | Status: AC
  Administered 2015-07-14: 2 g via INTRAVENOUS
  Filled 2015-07-14: qty 50

## 2015-07-14 MED ORDER — NEOSTIGMINE METHYLSULFATE 10 MG/10ML IV SOLN
INTRAVENOUS | Status: DC | PRN
  Administered 2015-07-14: 4 mg via INTRAVENOUS

## 2015-07-14 MED ORDER — ONDANSETRON HCL 4 MG/2ML IJ SOLN: 4.0000 mg | Freq: Once | INTRAMUSCULAR | Status: DC | PRN

## 2015-07-14 MED ORDER — FENTANYL CITRATE (PF) 100 MCG/2ML IJ SOLN
INTRAMUSCULAR | Status: DC | PRN
  Administered 2015-07-14 (×4): 50 ug via INTRAVENOUS

## 2015-07-14 MED ORDER — ROCURONIUM BROMIDE 100 MG/10ML IV SOLN
INTRAVENOUS | Status: DC | PRN
  Administered 2015-07-14: 40 mg via INTRAVENOUS

## 2015-07-14 MED ORDER — OXYCODONE-ACETAMINOPHEN 5-325 MG PO TABS
1.0000 | ORAL_TABLET | Freq: Four times a day (QID) | ORAL | Status: DC | PRN
  Administered 2015-07-14: 1 via ORAL

## 2015-07-14 MED ORDER — CHLORHEXIDINE GLUCONATE 4 % EX LIQD: 1.0000 "application " | Freq: Once | CUTANEOUS | Status: DC

## 2015-07-14 MED ORDER — FAMOTIDINE 20 MG PO TABS
20.0000 mg | ORAL_TABLET | Freq: Once | ORAL | Status: AC
  Administered 2015-07-14: 20 mg via ORAL

## 2015-07-14 MED ORDER — OXYCODONE-ACETAMINOPHEN 5-325 MG PO TABS
ORAL_TABLET | ORAL | Status: AC
  Filled 2015-07-14: qty 1

## 2015-07-14 MED ORDER — FENTANYL CITRATE (PF) 100 MCG/2ML IJ SOLN
INTRAMUSCULAR | Status: AC
  Filled 2015-07-14: qty 2

## 2015-07-14 MED ORDER — SODIUM CHLORIDE 0.9 % IR SOLN
Status: DC | PRN
  Administered 2015-07-14: 650 mL

## 2015-07-14 MED ORDER — FENTANYL CITRATE (PF) 100 MCG/2ML IJ SOLN
25.0000 ug | INTRAMUSCULAR | Status: DC | PRN
  Administered 2015-07-14 (×4): 25 ug via INTRAVENOUS

## 2015-07-14 SURGICAL SUPPLY — 49 items
APPLIER CLIP 5 13 M/L LIGAMAX5 (MISCELLANEOUS) ×2
BAG COUNTER SPONGE EZ (MISCELLANEOUS) ×2 IMPLANT
BENZOIN TINCTURE PRP APPL 2/3 (GAUZE/BANDAGES/DRESSINGS) ×2 IMPLANT
BULB RESERV EVAC DRAIN JP 100C (MISCELLANEOUS) IMPLANT
CANISTER SUCT 1200ML W/VALVE (MISCELLANEOUS) ×2 IMPLANT
CATH CHOLANG 76X19 KUMAR (CATHETERS) IMPLANT
CHLORAPREP W/TINT 26ML (MISCELLANEOUS) ×2 IMPLANT
CLIP APPLIE 5 13 M/L LIGAMAX5 (MISCELLANEOUS) ×1 IMPLANT
DEFOGGER SCOPE WARMER CLEARIFY (MISCELLANEOUS) ×2 IMPLANT
DISSECTOR KITTNER STICK (MISCELLANEOUS) ×1 IMPLANT
DISSECTORS/KITTNER STICK (MISCELLANEOUS) ×2
DRAIN CHANNEL JP 19F (MISCELLANEOUS) IMPLANT
DRAPE C-ARM XRAY 36X54 (DRAPES) ×2 IMPLANT
DRAPE SHEET LG 3/4 BI-LAMINATE (DRAPES) ×2 IMPLANT
DRAPE UTILITY 15X26 TOWEL STRL (DRAPES) ×4 IMPLANT
DRESSING TELFA 4X3 1S ST N-ADH (GAUZE/BANDAGES/DRESSINGS) ×2 IMPLANT
DRSG TEGADERM 2-3/8X2-3/4 SM (GAUZE/BANDAGES/DRESSINGS) ×8 IMPLANT
DRSG TEGADERM 2X2.25 PEDS (GAUZE/BANDAGES/DRESSINGS) ×2 IMPLANT
DRSG TELFA 3X8 NADH (GAUZE/BANDAGES/DRESSINGS) ×2 IMPLANT
ENDOPOUCH RETRIEVER 10 (MISCELLANEOUS) ×2 IMPLANT
GLOVE BIO SURGEON STRL SZ7.5 (GLOVE) ×10 IMPLANT
GOWN STRL REUS W/ TWL LRG LVL3 (GOWN DISPOSABLE) ×2 IMPLANT
GOWN STRL REUS W/ TWL XL LVL3 (GOWN DISPOSABLE) ×1 IMPLANT
GOWN STRL REUS W/TWL LRG LVL3 (GOWN DISPOSABLE) ×2
GOWN STRL REUS W/TWL XL LVL3 (GOWN DISPOSABLE) ×1
IRRIGATION STRYKERFLOW (MISCELLANEOUS) ×1 IMPLANT
IRRIGATOR STRYKERFLOW (MISCELLANEOUS) ×2
IV NS 1000ML (IV SOLUTION) ×1
IV NS 1000ML BAXH (IV SOLUTION) ×1 IMPLANT
LABEL OR SOLS (LABEL) ×2 IMPLANT
NEEDLE HYPO 25X1 1.5 SAFETY (NEEDLE) ×2 IMPLANT
NS IRRIG 500ML POUR BTL (IV SOLUTION) ×2 IMPLANT
PACK LAP CHOLECYSTECTOMY (MISCELLANEOUS) ×2 IMPLANT
PAD GROUND ADULT SPLIT (MISCELLANEOUS) ×2 IMPLANT
SCISSORS METZENBAUM CVD 33 (INSTRUMENTS) ×2 IMPLANT
SLEEVE ADV FIXATION 5X100MM (TROCAR) ×4 IMPLANT
STRAP SAFETY BODY (MISCELLANEOUS) ×2 IMPLANT
STRIP CLOSURE SKIN 1/2X4 (GAUZE/BANDAGES/DRESSINGS) ×2 IMPLANT
SUT ETHILON 3-0 FS-10 30 BLK (SUTURE)
SUT ETHILON 4-0 (SUTURE) ×2
SUT ETHILON 4-0 FS2 18XMFL BLK (SUTURE) ×2
SUT VIC AB 0 UR5 27 (SUTURE) ×6 IMPLANT
SUT VIC AB 4-0 FS2 27 (SUTURE) ×2 IMPLANT
SUTURE EHLN 3-0 FS-10 30 BLK (SUTURE) IMPLANT
SUTURE ETHLN 4-0 FS2 18XMF BLK (SUTURE) ×2 IMPLANT
SWABSTK COMLB BENZOIN TINCTURE (MISCELLANEOUS) ×2 IMPLANT
TROCAR XCEL BLUNT TIP 100MML (ENDOMECHANICALS) ×2 IMPLANT
TROCAR Z-THREAD OPTICAL 5X100M (TROCAR) ×2 IMPLANT
TUBING INSUFFLATOR HI FLOW (MISCELLANEOUS) ×2 IMPLANT

## 2015-07-14 NOTE — Transfer of Care (Signed)
Immediate Anesthesia Transfer of Care Note  Patient: Miranda Gordon  Procedure(s) Performed: Procedure(s): LAPAROSCOPIC CHOLECYSTECTOMY (N/A)  Patient Location: PACU  Anesthesia Type:General  Level of Consciousness: sedated  Airway & Oxygen Therapy: Patient Spontanous Breathing and Patient connected to face mask oxygen  Post-op Assessment: Report given to RN and Post -op Vital signs reviewed and stable  Post vital signs: Reviewed and stable  Last Vitals:  Filed Vitals:   07/14/15 0947  BP: 133/70  Pulse: 89  Temp: 36.8 C  Resp: 16  1157 VS  Hr 63 BP 109/55 Resp 14  Complications: No apparent anesthesia complications

## 2015-07-14 NOTE — Anesthesia Preprocedure Evaluation (Signed)
Anesthesia Evaluation  Patient identified by MRN, date of birth, ID band Patient awake    Reviewed: Allergy & Precautions, NPO status , Patient's Chart, lab work & pertinent test results  Airway Mallampati: II  TM Distance: >3 FB Neck ROM: Full    Dental no notable dental hx.    Pulmonary asthma ,    Pulmonary exam normal        Cardiovascular Normal cardiovascular exam  Possible PACs   Neuro/Psych  Headaches, Anxiety    GI/Hepatic Neg liver ROS, GERD  Medicated and Controlled,  Endo/Other  negative endocrine ROS  Renal/GU negative Renal ROS     Musculoskeletal negative musculoskeletal ROS (+)   Abdominal   Peds negative pediatric ROS (+)  Hematology  (+) anemia ,   Anesthesia Other Findings   Reproductive/Obstetrics negative OB ROS                             Anesthesia Physical Anesthesia Plan  ASA: II  Anesthesia Plan: General   Post-op Pain Management:    Induction: Intravenous  Airway Management Planned: Oral ETT  Additional Equipment:   Intra-op Plan:   Post-operative Plan: Extubation in OR  Informed Consent: I have reviewed the patients History and Physical, chart, labs and discussed the procedure including the risks, benefits and alternatives for the proposed anesthesia with the patient or authorized representative who has indicated his/her understanding and acceptance.   Dental advisory given  Plan Discussed with: CRNA and Surgeon  Anesthesia Plan Comments:         Anesthesia Quick Evaluation

## 2015-07-14 NOTE — Anesthesia Procedure Notes (Signed)
Procedure Name: Intubation Date/Time: 07/14/2015 10:51 AM Performed by: Charna Busman Pre-anesthesia Checklist: Patient identified, Emergency Drugs available, Suction available and Patient being monitored Patient Re-evaluated:Patient Re-evaluated prior to inductionOxygen Delivery Method: Circle system utilized Preoxygenation: Pre-oxygenation with 100% oxygen Intubation Type: IV induction and Combination inhalational/ intravenous induction Ventilation: Mask ventilation without difficulty Laryngoscope Size: Miller and 3 Grade View: Grade II Tube type: Oral Tube size: 7.0 mm Number of attempts: 1 Airway Equipment and Method: Stylet Placement Confirmation: ETT inserted through vocal cords under direct vision,  positive ETCO2,  CO2 detector and breath sounds checked- equal and bilateral Secured at: 21 cm Tube secured with: Tape Dental Injury: Teeth and Oropharynx as per pre-operative assessment

## 2015-07-14 NOTE — H&P (View-Only) (Signed)
Patient ID: Miranda Gordon, female   DOB: January 04, 1986, 29 y.o.   MRN: [id number]  Chief Complaint  Patient presents with  . Cholelithiasis    surgical consult    HPI Location, Quality, Duration, Severity, Timing, Context, Modifying Factors, Associated Signs and Symptoms.  Miranda Gordon is a 29 y.o. female.  Who is otherwise healthy referred by the emergency room following the sudden onset of right upper quadrant and epigastric abdominal pain following eating pizza associated with nausea but no emesis. The patient had a workup with normal liver function tests and lipase and ultrasound demonstrated a 1.5 cm gallstone which was mobile. No B evidence of biliary ductal dilatation pericholecystic fluid or gallbladder wall thickening. The patient does relate a 3-4 month history of what she describes as heartburn associated with intermittent right upper quadrant abdominal pain following meals.  Past Medical History  Diagnosis Date  . Anxiety   . Cholelithiasis     Past Surgical History  Procedure Laterality Date  . Mandible surgery      Family History  Problem Relation Age of Onset  . Cancer Father     Social History Social History  Substance Use Topics  . Smoking status: Never Smoker   . Smokeless tobacco: Never Used  . Alcohol Use: No    No Known Allergies  Current Outpatient Prescriptions  Medication Sig Dispense Refill  . ondansetron (ZOFRAN ODT) 4 MG disintegrating tablet Take 1 tablet (4 mg total) by mouth every 8 (eight) hours as needed for nausea or vomiting. 20 tablet 0  . oxyCODONE-acetaminophen (ROXICET) 5-325 MG per tablet Take 1 tablet by mouth every 4 (four) hours as needed for severe pain. 20 tablet 0   No current facility-administered medications for this visit.    Blood pressure 124/68, pulse 78, temperature 98.5 F (36.9 C), temperature source Oral, height _0  (1.549 m), weight 152 lb (68.947 kg), last menstrual period 06/13/2015.  Results for orders  placed or performed during the hospital encounter of 06/28/15 (from the past 48 hour(s))  Urinalysis complete, with microscopic ([facility] only)     Status: Abnormal   Collection Time: 06/28/15 12:16 AM  Result Value Ref Range   Color, Urine YELLOW (A) YELLOW   APPearance CLEAR (A) CLEAR   Glucose, UA NEGATIVE NEGATIVE mg/dL   Bilirubin Urine NEGATIVE NEGATIVE   Ketones, ur NEGATIVE NEGATIVE mg/dL   Specific Gravity, Urine 1.016 1.005 - 1.030   Hgb urine dipstick NEGATIVE NEGATIVE   pH 6.0 5.0 - 8.0   Protein, ur NEGATIVE NEGATIVE mg/dL   Nitrite NEGATIVE NEGATIVE   Leukocytes, UA 3+ (A) NEGATIVE   RBC / HPF 0-5 0 - 5 RBC/hpf   WBC, UA 0-5 0 - 5 WBC/hpf   Bacteria, UA NONE SEEN NONE SEEN   Squamous Epithelial / LPF 6-30 (A) NONE SEEN   Mucous PRESENT   Lipase, blood     Status: Abnormal   Collection Time: 06/28/15 12:19 AM  Result Value Ref Range   Lipase 54 (H) 22 - 51 U/L  Comprehensive metabolic panel     Status: Abnormal   Collection Time: 06/28/15 12:19 AM  Result Value Ref Range   Sodium 138 135 - 145 mmol/L   Potassium 3.5 3.5 - 5.1 mmol/L   Chloride 106 101 - 111 mmol/L   CO2 25 22 - 32 mmol/L   Glucose, Bld 111 (H) 65 - 99 mg/dL   BUN 8 6 - 20 mg/dL   Creatinine, Ser 0.54 0.44 -  1.00 mg/dL   Calcium 10.1 8.9 - 10.3 mg/dL   Total Protein 7.7 6.5 - 8.1 g/dL   Albumin 4.3 3.5 - 5.0 g/dL   AST 33 15 - 41 U/L   ALT 26 14 - 54 U/L   Alkaline Phosphatase 46 38 - 126 U/L   Total Bilirubin 0.1 (L) 0.3 - 1.2 mg/dL   GFR calc non Af Amer >60 >60 mL/min   GFR calc Af Amer >60 >60 mL/min    Comment: (NOTE) The eGFR has been calculated using the CKD EPI equation. This calculation has not been validated in all clinical situations. eGFR's persistently <60 mL/min signify possible Chronic Kidney Disease.    Anion gap 7 5 - 15  CBC     Status: Abnormal   Collection Time: 06/28/15 12:19 AM  Result Value Ref Range   WBC 10.3 3.6 - 11.0 K/uL   RBC 4.76 3.80 - 5.20 MIL/uL    Hemoglobin 11.5 (L) 12.0 - 16.0 g/dL   HCT 35.5 35.0 - 47.0 %   MCV 74.5 (L) 80.0 - 100.0 fL   MCH 24.1 (L) 26.0 - 34.0 pg   MCHC 32.3 32.0 - 36.0 g/dL   RDW 14.3 11.5 - 14.5 %   Platelets 297 150 - 440 K/uL  Pregnancy, urine POC     Status: None   Collection Time: 06/28/15 12:27 AM  Result Value Ref Range   Preg Test, Ur NEGATIVE NEGATIVE    Comment:        THE SENSITIVITY OF THIS METHODOLOGY IS >24 mIU/mL    US Abdomen Limited Ruq  06/28/2015   CLINICAL DATA:  Right upper quadrant and epigastric pain for 2 days.  EXAM: US ABDOMEN LIMITED - RIGHT UPPER QUADRANT  COMPARISON:  None.  FINDINGS: Gallbladder:  Cholelithiasis with mobile shadowing stone at the gallbladder neck measuring 1.5 cm. Gallbladder is mildly distended. No wall thickening visualized, wall thickness of 1.7 mm. Sonographic Murphy sign is positive.  Common bile duct:  Diameter: 2.5 mm, normal  Liver:  No focal lesion identified. Within normal limits in parenchymal echogenicity.  IMPRESSION: Mobile gallstone with mild gallbladder distention but no wall thickening. In the setting of positive sonographic Murphy sign, acute cholecystitis is considered. Nuclear medicine hepatobiliary scan could be considered for further evaluation based on clinical concern. No biliary dilatation.   Electronically Signed   By: Jeb Levering M.D.   On: 06/28/2015 02:30    Review of Systems  Constitutional: Negative for fever, chills, weight loss, malaise/fatigue and diaphoresis.  Gastrointestinal: Positive for heartburn, nausea and abdominal pain. Negative for vomiting, diarrhea, constipation, blood in stool and melena.  Genitourinary: Negative for dysuria.  Musculoskeletal: Negative.   Skin: Negative for itching and rash.  Endo/Heme/Allergies: Negative.   Psychiatric/Behavioral: Negative for depression and suicidal ideas.  All other systems reviewed and are negative.   Physical Exam  Constitutional: She is oriented to person, place, and  time and well-developed, well-nourished, and in no distress. No distress.  HENT:  Head: Normocephalic.  Eyes: Conjunctivae are normal. Pupils are equal, round, and reactive to light.  Neck: Normal range of motion. No thyromegaly present.  Cardiovascular: Normal rate and regular rhythm.   Pulmonary/Chest: Effort normal.  Abdominal: Soft. She exhibits no distension and no mass. There is no tenderness. There is no rebound and no guarding.  Neurological: She is oriented to person, place, and time.  Skin: Skin is warm and dry. She is not diaphoretic.  Psychiatric: Mood, memory, affect and  judgment normal.   Data Reviewed Ultrasound of the right upper quadrant and liver was personally reviewed on the PACS monitor.  I have personally reviewed the patient's imaging, laboratory findings and medical records.    Assessment:    Pleasant 29 year old otherwise healthy white female with 3 children and a remote history of cholelithiasis presents with recurrent biliary colic and an episode of significant cholecystitis on Monday. She is interested in surgical intervention.    Plan:    We discussed laparoscopic cholecystectomy to be performed a week of September 5. I discussed with her and her husband in detail the procedure how it is done laparoscopically the risk of open operation bile duct injury being 1 in 200.  Discussed possible retained stones, ERCP need.  All questions addressed.          Sherri Rad MD, FACS 06/29/2015, 3:03 PM

## 2015-07-14 NOTE — Op Note (Signed)
07/14/2015  11:51 AM  PATIENT:  Miranda Gordon  29 y.o. female  PRE-OPERATIVE DIAGNOSIS:  Recurrent biliary colic and cholelithiasis.  POST-OPERATIVE DIAGNOSIS:  same  PROCEDURE:  Procedure(s): LAPAROSCOPIC CHOLECYSTECTOMY (N/A)  SURGEON:  Surgeon(s) and Role:    * Natale Lay, MD - Primary  ASSISTANTS: Tech  ANESTHESIA: General endotracheal  SPECIMEN: Gallbladder and contents    EBL: Minimal  Findings cholelithiasis  Description of procedure:    With informed consent, supine position, general endotracheal anesthesia the patient's left arm was padded and tucked at her side her abdomen was widely prepped and draped with chloroprep solution and a time out was observed.   A 12 mm blunt Hassan trocar was placed via an  open technique through an infraumbilical transversely oriented skin incision. Stay sutures were passed to the fascia. The patient was then positioned in reverse Trendelenburg and airplane right side up and pneumoperitoneum was established. A 5 mm blade less trocar was placed in the epigastric region and 2 5 mm ports in the right subcostal margin.  The gallbladder was placed on traction with lateral traction placed on Hartman's pouch. The hepatoduodenal ligament was then incised. Adhesions of the omentum were taken down with sharp technique and point cautery. Critical view of safety cholecystectomy was achieved. There was some moderate scarring in this area. Duct and cystic artery were triply clipped on the portal side singly clipped on the gallbladder side and divided. The gallbladder was then retrieved up gallbladder fossa utilizing hook cautery apparatus placed into an Endo Catch device and retrieved.  Pneumoperitoneum was established once again. A 5 mm surgical telescope was introduced from the epigastric port demonstrating no evidence of bowel injury with trocar insertion.   Ports are then removed under direct visualization. A total of 30 cc of 0.25% plain Marcaine was  infiltrated along all skin and fascial incisions are to closure.   The fascial defect was reapproximated with an additional figure-of-eight #0 Vicryl suture in vertical orientation. Skin edges were reapproximated utilizing 4-0 Vicryl. 4-0 nylon's were placed in the skin edges as well as or bleeding. Sterile dressings were applied and the patient was orally extubated and taken to the recovery room in stable and satisfactory condition by anesthesia services.

## 2015-07-14 NOTE — Anesthesia Postprocedure Evaluation (Signed)
  Anesthesia Post-op Note  Patient: Miranda Gordon  Procedure(s) Performed: Procedure(s): LAPAROSCOPIC CHOLECYSTECTOMY (N/A)  Anesthesia type:General  Patient location: PACU  Post pain: Pain level controlled  Post assessment: Post-op Vital signs reviewed, Patient's Cardiovascular Status Stable, Respiratory Function Stable, Patent Airway and No signs of Nausea or vomiting  Post vital signs: Reviewed and stable  Last Vitals:  Filed Vitals:   07/14/15 1358  BP: 104/60  Pulse: 67  Temp:   Resp: 18    Level of consciousness: awake, alert  and patient cooperative  Complications: No apparent anesthesia complications

## 2015-07-14 NOTE — Interval H&P Note (Signed)
History and Physical Interval Note:  07/14/2015 10:28 AM  Miranda Gordon  has presented today for surgery, with the diagnosis of RECURRENT BILIARY COLIC  The various methods of treatment have been discussed with the patient and family. After consideration of risks, benefits and other options for treatment, the patient has consented to  Procedure(s): LAPAROSCOPIC CHOLECYSTECTOMY (N/A) as a surgical intervention .  The patient's history has been reviewed, patient examined, no change in status, stable for surgery.  I have reviewed the patient's chart and labs.  Questions were answered to the patient's satisfaction.   Lungs exam is clear.    Natale Lay

## 2015-07-15 ENCOUNTER — Encounter: Payer: Self-pay | Admitting: Surgery

## 2015-07-18 LAB — SURGICAL PATHOLOGY

## 2015-07-25 ENCOUNTER — Ambulatory Visit: Payer: BLUE CROSS/BLUE SHIELD | Admitting: Surgery

## 2015-07-27 ENCOUNTER — Encounter: Payer: Self-pay | Admitting: Surgery

## 2015-07-27 ENCOUNTER — Ambulatory Visit (INDEPENDENT_AMBULATORY_CARE_PROVIDER_SITE_OTHER): Payer: BLUE CROSS/BLUE SHIELD | Admitting: Surgery

## 2015-07-27 VITALS — BP 98/48 | HR 81 | Temp 98.3°F | Ht 61.0 in | Wt 150.0 lb

## 2015-07-27 DIAGNOSIS — Z09 Encounter for follow-up examination after completed treatment for conditions other than malignant neoplasm: Secondary | ICD-10-CM

## 2015-07-27 NOTE — Progress Notes (Signed)
General Surgery Postop Notes  S: Doing well.  No pain.  Tolerating diet.  Having good BM O:Blood pressure 98/48, pulse 81, temperature 98.3 F (36.8 C), temperature source Oral, height  (1.549 m), weight 150 lb (68.04 kg), last menstrual period 07/08/2015. GEN: NAD/A&Ox3 ABD: soft, nontender, nondistended, incisions c/d/i  A/P 29 yo F s/p lap chole.  Doing well.  Path reviewed that there was a small area of dysplasia.   - f/u prn - no heavy lifting x 4 weeks

## 2015-07-27 NOTE — Patient Instructions (Addendum)
Follow up in office as needed. Do not drive on pain medications Do not lift greater than 15 lbs for a period of 4 more weeks Call or return to ER if you develop fever greater than 101.5, nausea/vomiting, increased pain, redness/drainage from incisions

## 2017-01-17 ENCOUNTER — Emergency Department
Admission: EM | Admit: 2017-01-17 | Discharge: 2017-01-17 | Disposition: A | Discharge status: Home / Self Care | Payer: BLUE CROSS/BLUE SHIELD | Attending: Emergency Medicine | Admitting: Emergency Medicine

## 2017-01-17 ENCOUNTER — Encounter: Payer: Self-pay | Admitting: *Deleted

## 2017-01-17 DIAGNOSIS — M542 Cervicalgia: Secondary | ICD-10-CM | POA: Diagnosis not present

## 2017-01-17 DIAGNOSIS — J45909 Unspecified asthma, uncomplicated: Secondary | ICD-10-CM | POA: Diagnosis not present

## 2017-01-17 DIAGNOSIS — Z79899 Other long term (current) drug therapy: Secondary | ICD-10-CM | POA: Insufficient documentation

## 2017-01-17 DIAGNOSIS — R519 Headache, unspecified: Secondary | ICD-10-CM

## 2017-01-17 DIAGNOSIS — R51 Headache: Secondary | ICD-10-CM | POA: Insufficient documentation

## 2017-01-17 LAB — COMPREHENSIVE METABOLIC PANEL
ALT: 25 U/L (ref 14–54)
AST: 30 U/L (ref 15–41)
Albumin: 4.3 g/dL (ref 3.5–5.0)
Alkaline Phosphatase: 59 U/L (ref 38–126)
Anion gap: 6 (ref 5–15)
BUN: 6 mg/dL (ref 6–20)
CO2: 24 mmol/L (ref 22–32)
Calcium: 9.7 mg/dL (ref 8.9–10.3)
Chloride: 109 mmol/L (ref 101–111)
Creatinine, Ser: 0.55 mg/dL (ref 0.44–1.00)
GFR calc Af Amer: >60 mL/min (ref >60)
GFR calc non Af Amer: >60 mL/min (ref >60)
Glucose, Bld: 98 mg/dL (ref 65–99)
Potassium: 3.6 mmol/L (ref 3.5–5.1)
Sodium: 139 mmol/L (ref 135–145)
Total Bilirubin: 0.5 mg/dL (ref 0.3–1.2)
Total Protein: 7.9 g/dL (ref 6.5–8.1)

## 2017-01-17 LAB — CBC WITH DIFFERENTIAL/PLATELET
Basophils Absolute: 0 10*3/uL (ref 0–0.1)
Basophils Relative: 0 %
Eosinophils Absolute: 0.2 10*3/uL (ref 0–0.7)
Eosinophils Relative: 2 %
HCT: 36.8 % (ref 35.0–47.0)
Hemoglobin: 12.6 g/dL (ref 12.0–16.0)
Lymphocytes Relative: 26 %
Lymphs Abs: 2.8 10*3/uL (ref 1.0–3.6)
MCH: 25.9 pg — ABNORMAL LOW (ref 26.0–34.0)
MCHC: 34.2 g/dL (ref 32.0–36.0)
MCV: 75.8 fL — ABNORMAL LOW (ref 80.0–100.0)
Monocytes Absolute: 0.8 10*3/uL (ref 0.2–0.9)
Monocytes Relative: 7 %
Neutro Abs: 6.7 10*3/uL — ABNORMAL HIGH (ref 1.4–6.5)
Neutrophils Relative %: 65 %
Platelets: 314 10*3/uL (ref 150–440)
RBC: 4.85 MIL/uL (ref 3.80–5.20)
RDW: 15.2 % — ABNORMAL HIGH (ref 11.5–14.5)
WBC: 10.5 10*3/uL (ref 3.6–11.0)

## 2017-01-17 MED ORDER — METOCLOPRAMIDE HCL 5 MG/ML IJ SOLN
10.0000 mg | Freq: Once | INTRAMUSCULAR | Status: AC
  Administered 2017-01-17: 10 mg via INTRAVENOUS
  Filled 2017-01-17: qty 2

## 2017-01-17 MED ORDER — KETOROLAC TROMETHAMINE 30 MG/ML IJ SOLN
30.0000 mg | Freq: Once | INTRAMUSCULAR | Status: AC
  Administered 2017-01-17: 30 mg via INTRAVENOUS
  Filled 2017-01-17: qty 1

## 2017-01-17 MED ORDER — BUTALBITAL-APAP-CAFFEINE 50-325-40 MG PO TABS: 1.0000 | ORAL_TABLET | Freq: Four times a day (QID) | ORAL | 0 refills | Status: DC | PRN

## 2017-01-17 MED ORDER — DIPHENHYDRAMINE HCL 50 MG/ML IJ SOLN
50.0000 mg | Freq: Once | INTRAMUSCULAR | Status: AC
  Administered 2017-01-17: 50 mg via INTRAVENOUS
  Filled 2017-01-17: qty 1

## 2017-01-17 MED ORDER — SODIUM CHLORIDE 0.9 % IV BOLUS (SEPSIS)
1000.0000 mL | Freq: Once | INTRAVENOUS | Status: AC
  Administered 2017-01-17: 1000 mL via INTRAVENOUS

## 2017-01-17 NOTE — ED Notes (Addendum)
Pt verbalizes head and neck pain has decreased. Pt A&O and in NAD at this time. Pt verbalizes understanding of DC instructions, follow up as well as prescription info. No questions at this time.

## 2017-01-17 NOTE — ED Provider Notes (Signed)
[facility] Emergency Department Provider Note  Time seen: 9:17 PM  I have reviewed the triage vital signs and the nursing notes.   HISTORY  Chief Complaint Headache    HPI Miranda Gordon is a 31 y.o. female with a past medical history of anemia, anxiety, migraines, presents to the emergency department with a left-sided headache and neck pain. According to the patient she was awoken this morning with a left-sided headache which she states is behind her left eye and going down into her neck. Patient states moderate neck pain. States some nausea, some light sensitivity, denies any vomiting. Denies any fever. Denies extremity weakness or numbness. Describes the headache as moderate neck pain as moderate. Neck pain is mostly on the left part of her neck into the left occipital scalp.  Past Medical History:  Diagnosis Date  . Anemia   . Anxiety   . Asthma   . Cholelithiasis   . Dysrhythmia    "extra beat" per pt -asymptomatic  . GERD (gastroesophageal reflux disease)   . Headache     Patient Active Problem List   Diagnosis Date Noted  . 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 CHOLECYSTECTOMY N/A 07/14/2015   Procedure: LAPAROSCOPIC CHOLECYSTECTOMY;  Surgeon: Natale Lay, MD;  Location: [facility] ORS;  Service: General;  Laterality: N/A;  . MANDIBLE SURGERY    . TONSILLECTOMY      Prior to Admission medications   Medication Sig Start Date End Date Taking? Authorizing Provider  calcium carbonate (TUMS - DOSED IN MG ELEMENTAL CALCIUM) 500 MG chewable tablet Chew 1 tablet by mouth as needed for indigestion or heartburn.    Historical Provider, MD    No Known Allergies  Family History  Problem Relation Age of Onset  . Cancer Father     Social History Social History  Substance Use Topics  . Smoking status: Never Smoker  . Smokeless tobacco: Never Used  . Alcohol use No    Review of  Systems Constitutional: Negative for fever. Cardiovascular: Negative for chest pain. Respiratory: Negative for shortness of breath. Gastrointestinal: Negative for abdominal pain Musculoskeletal: Negative for back pain.Positive for neck pain, left-sided neck pain. Skin: Negative for rash. Neurological: Moderate headache. Mostly left-sided. Denies weakness or numbness 10-point ROS otherwise negative.  ____________________________________________   PHYSICAL EXAM:  VITAL SIGNS: ED Triage Vitals  Enc Vitals Group     BP 01/17/17 1839 136/80     Pulse Rate 01/17/17 1839 96     Resp 01/17/17 1839 20     Temp 01/17/17 1839 98.2 F (36.8 C)     Temp Source 01/17/17 1839 Oral     SpO2 01/17/17 1839 99 %     Weight 01/17/17 1839 150 lb (68 kg)     Height 01/17/17 1839 5\' 2"  (1.575 m)     Head Circumference --      Peak Flow --      Pain Score 01/17/17 1846 7     Pain Loc --      Pain Edu? --      Excl. in GC? --     Constitutional: Alert and oriented. Well appearing and in no distress. Eyes: Normal exam PERRL, EOMI, mild photophobia. ENT   Head: Normocephalic and atraumatic.Moderate left-sided cervical paraspinal tenderness to palpation. No nuchal rigidity on exam.   Mouth/Throat: Mucous membranes are moist. Cardiovascular: Normal rate, regular rhythm. No murmur Respiratory: Normal respiratory effort without tachypnea  nor retractions. Breath sounds are clear  Gastrointestinal: Soft and nontender. No distention. Musculoskeletal: Nontender with normal range of motion in all extremities. Neurologic:  Normal speech and language. No gross focal neurologic deficits. 5/5 motor in all extremities. Equal grip strength. No pronator drift. Cranial nerves intact. Skin:  Skin is warm, dry and intact.  Psychiatric: Mood and affect are normal.   ____________________________________________   INITIAL IMPRESSION / ASSESSMENT AND PLAN / ED COURSE  Pertinent labs & imaging results that  were available during my care of the patient were reviewed by me and considered in my medical decision making (see chart for details).  Patient presents to the emergency department with a headache since this morning. Has a history of migraines took her home migraine medications without relief. She states the headache is worse and now says it with nausea and photosensitivity. She has pain and tenderness in the left cervical paraspinal muscles. Patient is able to range neck, no appreciable nuchal rigidity on exam. Patient states her migraines typically involve her entire head and it is abnormal but would only affects the left side. We will check labs, treat the patient's migraine and closely monitor. Reassuringly the patient has no fever and vitals are largely within normal limits besides a slight tachycardia.  Patient's labs are largely within normal limits. White blood cell count of 10.5. Patient states her headache is much improved, she appears very comfortable. Rates her headache as a 3/10 currently. I discussed return precautions for any fever or increased headache/neck pain. Otherwise the patient is to follow-up with a primary care doctor. We will prescribe the patient Fioricet to be used if she has any headache remaining tomorrow. Patient agreeable to plan. ____________________________________________   FINAL CLINICAL IMPRESSION(S) / ED DIAGNOSES  Headache Neck pain    Minna AntisKevin Marchetta Navratil, MD 01/17/17 2237

## 2017-01-17 NOTE — ED Triage Notes (Signed)
Pt has a headache since this morning   Pt took otc meds without relief.  Pt reports pain radiaiting into neck.  Pt has nausea.  Pt alert.  Speech clear.

## 2017-12-01 ENCOUNTER — Emergency Department: Payer: BLUE CROSS/BLUE SHIELD

## 2017-12-01 ENCOUNTER — Emergency Department
Admission: EM | Admit: 2017-12-01 | Discharge: 2017-12-01 | Disposition: A | Discharge status: Home / Self Care | Payer: BLUE CROSS/BLUE SHIELD | Attending: Emergency Medicine | Admitting: Emergency Medicine

## 2017-12-01 ENCOUNTER — Encounter: Payer: Self-pay | Admitting: Emergency Medicine

## 2017-12-01 ENCOUNTER — Other Ambulatory Visit: Payer: Self-pay

## 2017-12-01 DIAGNOSIS — N200 Calculus of kidney: Secondary | ICD-10-CM | POA: Insufficient documentation

## 2017-12-01 DIAGNOSIS — J45909 Unspecified asthma, uncomplicated: Secondary | ICD-10-CM | POA: Diagnosis not present

## 2017-12-01 DIAGNOSIS — R1031 Right lower quadrant pain: Secondary | ICD-10-CM | POA: Diagnosis present

## 2017-12-01 LAB — COMPREHENSIVE METABOLIC PANEL
ALT: 17 U/L (ref 14–54)
AST: 25 U/L (ref 15–41)
Albumin: 4.3 g/dL (ref 3.5–5.0)
Alkaline Phosphatase: 61 U/L (ref 38–126)
Anion gap: 6 (ref 5–15)
BUN: 8 mg/dL (ref 6–20)
CO2: 21 mmol/L — ABNORMAL LOW (ref 22–32)
Calcium: 9.9 mg/dL (ref 8.9–10.3)
Chloride: 108 mmol/L (ref 101–111)
Creatinine, Ser: 0.61 mg/dL (ref 0.44–1.00)
GFR calc Af Amer: >60 mL/min (ref >60)
GFR calc non Af Amer: >60 mL/min (ref >60)
Glucose, Bld: 138 mg/dL — ABNORMAL HIGH (ref 65–99)
Potassium: 3.5 mmol/L (ref 3.5–5.1)
Sodium: 135 mmol/L (ref 135–145)
Total Bilirubin: 0.3 mg/dL (ref 0.3–1.2)
Total Protein: 7.5 g/dL (ref 6.5–8.1)

## 2017-12-01 LAB — URINALYSIS, COMPLETE (UACMP) WITH MICROSCOPIC
Bilirubin Urine: NEGATIVE
Glucose, UA: NEGATIVE mg/dL
Ketones, ur: NEGATIVE mg/dL
Leukocytes, UA: NEGATIVE
Nitrite: NEGATIVE
Protein, ur: NEGATIVE mg/dL
Specific Gravity, Urine: 1.027 (ref 1.005–1.030)
pH: 5 (ref 5.0–8.0)

## 2017-12-01 LAB — CBC
HCT: 36.6 % (ref 35.0–47.0)
Hemoglobin: 11.8 g/dL — ABNORMAL LOW (ref 12.0–16.0)
MCH: 24.7 pg — ABNORMAL LOW (ref 26.0–34.0)
MCHC: 32.1 g/dL (ref 32.0–36.0)
MCV: 76.8 fL — ABNORMAL LOW (ref 80.0–100.0)
Platelets: 342 10*3/uL (ref 150–440)
RBC: 4.77 MIL/uL (ref 3.80–5.20)
RDW: 14.3 % (ref 11.5–14.5)
WBC: 13.2 10*3/uL — ABNORMAL HIGH (ref 3.6–11.0)

## 2017-12-01 LAB — POCT PREGNANCY, URINE: Preg Test, Ur: NEGATIVE

## 2017-12-01 MED ORDER — ONDANSETRON HCL 4 MG/2ML IJ SOLN
4.0000 mg | Freq: Once | INTRAMUSCULAR | Status: AC
  Administered 2017-12-01: 4 mg via INTRAVENOUS
  Filled 2017-12-01: qty 2

## 2017-12-01 MED ORDER — NAPROXEN 500 MG PO TABS: 500.0000 mg | ORAL_TABLET | Freq: Two times a day (BID) | ORAL | 2 refills | Status: DC

## 2017-12-01 MED ORDER — MORPHINE SULFATE (PF) 4 MG/ML IV SOLN
4.0000 mg | Freq: Once | INTRAVENOUS | Status: AC
  Administered 2017-12-01: 4 mg via INTRAVENOUS

## 2017-12-01 MED ORDER — TAMSULOSIN HCL 0.4 MG PO CAPS: 0.4000 mg | ORAL_CAPSULE | Freq: Every day | ORAL | 0 refills | Status: DC

## 2017-12-01 MED ORDER — ONDANSETRON HCL 4 MG/2ML IJ SOLN: 4.0000 mg | Freq: Once | INTRAMUSCULAR | Status: DC

## 2017-12-01 MED ORDER — KETOROLAC TROMETHAMINE 30 MG/ML IJ SOLN
30.0000 mg | Freq: Once | INTRAMUSCULAR | Status: AC
  Administered 2017-12-01: 30 mg via INTRAVENOUS
  Filled 2017-12-01: qty 1

## 2017-12-01 MED ORDER — OXYCODONE-ACETAMINOPHEN 5-325 MG PO TABS
1.0000 | ORAL_TABLET | Freq: Once | ORAL | Status: AC
  Administered 2017-12-01: 1 via ORAL

## 2017-12-01 MED ORDER — OXYCODONE-ACETAMINOPHEN 5-325 MG PO TABS
ORAL_TABLET | ORAL | Status: AC
  Administered 2017-12-01: 1 via ORAL
  Filled 2017-12-01: qty 1

## 2017-12-01 MED ORDER — HYDROCODONE-ACETAMINOPHEN 5-325 MG PO TABS: 1.0000 | ORAL_TABLET | ORAL | 0 refills | Status: DC | PRN

## 2017-12-01 MED ORDER — SODIUM CHLORIDE 0.9 % IV BOLUS (SEPSIS)
1000.0000 mL | Freq: Once | INTRAVENOUS | Status: AC
  Administered 2017-12-01: 1000 mL via INTRAVENOUS

## 2017-12-01 MED ORDER — SODIUM CHLORIDE 0.9 % IV SOLN: 1000.0000 mL | Freq: Once | INTRAVENOUS | Status: DC

## 2017-12-01 MED ORDER — MORPHINE SULFATE (PF) 4 MG/ML IV SOLN
INTRAVENOUS | Status: AC
  Filled 2017-12-01: qty 1

## 2017-12-01 MED ORDER — ONDANSETRON 4 MG PO TBDP: 4.0000 mg | ORAL_TABLET | Freq: Three times a day (TID) | ORAL | 0 refills | Status: DC | PRN

## 2017-12-01 MED ORDER — KETOROLAC TROMETHAMINE 30 MG/ML IJ SOLN: 30.0000 mg | Freq: Once | INTRAMUSCULAR | Status: DC

## 2017-12-01 NOTE — ED Triage Notes (Signed)
Pt reports right lower back and right lower quadrant pain x 60 minutes; has not taken any medication for the pain; no history of same; denies injury; denies urinary s/s; denies diarrhea; mild nausea, no vomiting; pt restless in triage;

## 2017-12-01 NOTE — ED Provider Notes (Signed)
[facility] Emergency Department Provider Note   ____________________________________________    I have reviewed the triage vital signs and the nursing notes.   HISTORY  Chief Complaint Abdominal Pain and Back Pain     HPI Miranda Gordon is a 32 y.o. female who presents with complaints of abrupt onset of right flank pain which started proximally 1 hour prior to arrival.  Patient reports the pain is severe and sharp in nature.  It radiates towards her right lower quadrant from her back.  No fevers or chills.  No dysuria.  Has not noticed any hematuria.  No history of kidney stones.  Mild nausea no vomiting.  History of a cholecystectomy in the past.  No history of other abdominal surgeries.  Normal stools.  Past Medical History:  Diagnosis Date  . Anemia   . Anxiety   . Asthma   . Cholelithiasis   . Dysrhythmia    "extra beat" per pt -asymptomatic  . GERD (gastroesophageal reflux disease)   . Headache     Patient Active Problem List   Diagnosis Date Noted  . 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 CHOLECYSTECTOMY N/A 07/14/2015   Procedure: LAPAROSCOPIC CHOLECYSTECTOMY;  Surgeon: Natale LayMark Bird, MD;  Location: [facility] ORS;  Service: General;  Laterality: N/A;  . MANDIBLE SURGERY    . TONSILLECTOMY      Prior to Admission medications   Medication Sig Start Date End Date Taking? Authorizing Provider  HYDROcodone-acetaminophen (NORCO/VICODIN) 5-325 MG tablet Take 1 tablet by mouth every 4 (four) hours as needed for moderate pain. 12/01/17   Jene EveryKinner, Zanyah Lentsch, MD  naproxen (NAPROSYN) 500 MG tablet Take 1 tablet (500 mg total) by mouth 2 (two) times daily with a meal. 12/01/17   Jene EveryKinner, Rosela Supak, MD  ondansetron (ZOFRAN ODT) 4 MG disintegrating tablet Take 1 tablet (4 mg total) by mouth every 8 (eight) hours as needed for nausea or vomiting. 12/01/17   Jene EveryKinner, Kaylea Mounsey, MD  tamsulosin (FLOMAX) 0.4 MG CAPS capsule  Take 1 capsule (0.4 mg total) by mouth daily. 12/01/17   Jene EveryKinner, Aaleyah Witherow, MD     Allergies Patient has no known allergies.  Family History  Problem Relation Age of Onset  . Cancer Father     Social History Social History   Tobacco Use  . Smoking status: Never Smoker  . Smokeless tobacco: Never Used  Substance Use Topics  . Alcohol use: No  . Drug use: No    Review of Systems  Constitutional: No fever/chills Eyes: No visual changes.  ENT: No sore throat. Cardiovascular: Denies chest pain. Respiratory: Denies shortness of breath. Gastrointestinal: Flank pain as above Genitourinary: Negative for dysuria. Musculoskeletal: Negative for back pain. Skin: Negative for rash. Neurological: Negative for headaches    ____________________________________________   PHYSICAL EXAM:  VITAL SIGNS: ED Triage Vitals  Enc Vitals Group     BP 12/01/17 2106 122/74     Pulse Rate 12/01/17 2106 75     Resp --      Temp 12/01/17 2106 98.2 F (36.8 C)     Temp Source 12/01/17 2106 Oral     SpO2 12/01/17 2106 100 %     Weight 12/01/17 2107 68 kg (150 lb)     Height 12/01/17 2107 1.575 m (5\' 2" )     Head Circumference --      Peak Flow --      Pain Score 12/01/17 2107 10  Pain Loc --      Pain Edu? --      Excl. in GC? --     Constitutional: Alert and oriented.  Uncomfortable Eyes: Conjunctivae are normal.  Head: Atraumatic. Nose: No congestion/rhinnorhea. Mouth/Throat: Mucous membranes are moist.   Neck:  Painless ROM Cardiovascular: Normal rate, regular rhythm.   Good peripheral circulation. Respiratory: Normal respiratory effort.  No retractions.  Gastrointestinal: Soft and nontender. No distention.  Mild right CVA tenderness Genitourinary: deferred Musculoskeletal:   Warm and well perfused Neurologic:  Normal speech and language. No gross focal neurologic deficits are appreciated.  Skin:  Skin is warm, dry and intact. No rash noted. Psychiatric: Mood and affect are  normal. Speech and behavior are normal.  ____________________________________________   LABS (all labs ordered are listed, but only abnormal results are displayed)  Labs Reviewed  COMPREHENSIVE METABOLIC PANEL - Abnormal; Notable for the following components:      Result Value   CO2 21 (*)    Glucose, Bld 138 (*)    All other components within normal limits  CBC - Abnormal; Notable for the following components:   WBC 13.2 (*)    Hemoglobin 11.8 (*)    MCV 76.8 (*)    MCH 24.7 (*)    All other components within normal limits  URINALYSIS, COMPLETE (UACMP) WITH MICROSCOPIC - Abnormal; Notable for the following components:   Color, Urine YELLOW (*)    APPearance CLOUDY (*)    Hgb urine dipstick SMALL (*)    Bacteria, UA RARE (*)    Squamous Epithelial / LPF 6-30 (*)    All other components within normal limits  POC URINE PREG, ED  POCT PREGNANCY, URINE   ____________________________________________  EKG  None ____________________________________________  RADIOLOGY  CT renal stone study demonstrates 4 mm stone distal right ureter ____________________________________________   PROCEDURES  Procedure(s) performed: No  Procedures   Critical Care performed: No ____________________________________________   INITIAL IMPRESSION / ASSESSMENT AND PLAN / ED COURSE  Pertinent labs & imaging results that were available during my care of the patient were reviewed by me and considered in my medical decision making (see chart for details).  Patient with abrupt onset right flank pain, suspicious for renal colic.  No history of kidney stones.  Will give IV Toradol, IV fluids, check labs, obtain CT renal stone study and reevaluate ----------------------------------------- 10:11 PM on 12/01/2017 -----------------------------------------   CT scan demonstrates distal 4 mm stone, pain has improved somewhat with Toradol will also add on morphine, lab work urinalysis  reassuring  ----------------------------------------- 10:36 PM on 12/01/2017 -----------------------------------------  Patient feeling significant better after morphine.  We will give p.o. medication given that she will not really get her prescriptions tonight, discussed with her medication regimen and outpatient follow-up and reasons for return    ____________________________________________   FINAL CLINICAL IMPRESSION(S) / ED DIAGNOSES  Final diagnoses:  Kidney stone        Note:  This document was prepared using Dragon voice recognition software and may include unintentional dictation errors.    Jene Every, MD 12/01/17 2236

## 2017-12-25 ENCOUNTER — Ambulatory Visit: Payer: BLUE CROSS/BLUE SHIELD | Admitting: Urology

## 2017-12-27 ENCOUNTER — Encounter: Payer: Self-pay | Admitting: Urology

## 2017-12-27 ENCOUNTER — Ambulatory Visit (INDEPENDENT_AMBULATORY_CARE_PROVIDER_SITE_OTHER): Payer: BLUE CROSS/BLUE SHIELD | Admitting: Urology

## 2017-12-27 VITALS — BP 118/82 | HR 73 | Ht 62.0 in | Wt 155.0 lb

## 2017-12-27 DIAGNOSIS — N201 Calculus of ureter: Secondary | ICD-10-CM | POA: Diagnosis not present

## 2017-12-27 DIAGNOSIS — N200 Calculus of kidney: Secondary | ICD-10-CM

## 2017-12-27 LAB — URINALYSIS, COMPLETE
Bilirubin, UA: NEGATIVE
Glucose, UA: NEGATIVE
Ketones, UA: NEGATIVE
Nitrite, UA: NEGATIVE
Protein, UA: NEGATIVE
Specific Gravity, UA: 1.01 (ref 1.005–1.030)
Urobilinogen, Ur: 0.2 mg/dL (ref 0.2–1.0)
pH, UA: 6.5 (ref 5.0–7.5)

## 2017-12-27 LAB — MICROSCOPIC EXAMINATION
Epithelial Cells (non renal): >10 /hpf — ABNORMAL HIGH (ref 0–10)
RBC, UA: NONE SEEN /hpf (ref 0–?)

## 2017-12-27 NOTE — Progress Notes (Signed)
12/27/2017 1:01 PM   Miranda ConradiLacey B Kriesel [birth year] [id number]  Referring provider: No referring provider defined for this encounter.  Chief Complaint  Patient presents with  . Follow-up    HPI: 32 year old female developed right flank pain 12/01/2017. She underwent a CT scan and was noted to have a 4 mm right distal stone. There were punctate stones in both kidneys. UA with 6-30 red cells her white count was 13 creatinine 0.6. Her pain improved. She did not see a stone pass. UA today with no MH.    PMH: Past Medical History:  Diagnosis Date  . Anemia   . Anxiety   . Asthma   . Cholelithiasis   . Dysrhythmia    "extra beat" per pt -asymptomatic  . GERD (gastroesophageal reflux disease)   . Headache     Surgical History: Past Surgical History:  Procedure Laterality Date  . CHOLECYSTECTOMY N/A 07/14/2015   Procedure: LAPAROSCOPIC CHOLECYSTECTOMY;  Surgeon: Natale LayMark Bird, MD;  Location: [facility] ORS;  Service: General;  Laterality: N/A;  . MANDIBLE SURGERY    . TONSILLECTOMY      Home Medications:  Allergies as of 12/27/2017   No Known Allergies     Medication List        Accurate as of 12/27/17  1:01 PM. Always use your most recent med list.          tamsulosin 0.4 MG Caps capsule Commonly known as:  FLOMAX Take 1 capsule (0.4 mg total) by mouth daily.       Allergies: No Known Allergies  Family History: Family History  Problem Relation Age of Onset  . Cancer Father     Social History:  reports that  has never smoked. she has never used smokeless tobacco. She reports that she does not drink alcohol or use drugs.  ROS: UROLOGY Frequent Urination?: No Hard to postpone urination?: No Burning/pain with urination?: No Get up at night to urinate?: No Leakage of urine?: No Urine stream starts and stops?: No Trouble starting stream?: No Do you have to strain to urinate?: No Blood in urine?: No Urinary tract infection?: No Sexually transmitted disease?:  No Injury to kidneys or bladder?: No Painful intercourse?: No Weak stream?: No Currently pregnant?: No Vaginal bleeding?: No Last menstrual period?: 12/14/2017  Gastrointestinal Nausea?: No Vomiting?: No Indigestion/heartburn?: Yes Diarrhea?: Yes Constipation?: Yes  Constitutional Fever: No Night sweats?: No Weight loss?: No Fatigue?: No  Skin Skin rash/lesions?: No Itching?: No  Eyes Blurred vision?: No Double vision?: No  Ears/Nose/Throat Sore throat?: No Sinus problems?: No  Hematologic/Lymphatic Swollen glands?: No Easy bruising?: No  Cardiovascular Leg swelling?: No Chest pain?: No  Respiratory Cough?: No Shortness of breath?: No  Endocrine Excessive thirst?: No  Musculoskeletal Back pain?: No Joint pain?: No  Neurological Headaches?: Yes Dizziness?: No  Psychologic Depression?: No Anxiety?: Yes  Physical Exam: BP 118/82   Pulse 73   Ht 5\' 2"  (1.575 m)   Wt 70.3 kg (155 lb)   BMI 28.35 kg/m   Constitutional:  Alert and oriented, No acute distress. HEENT: [place] AT, moist mucus membranes.  Trachea midline, no masses. Cardiovascular: No clubbing, cyanosis, or edema. Respiratory: Normal respiratory effort, no increased work of breathing. GI: Abdomen is soft, nontender, nondistended, no abdominal masses GU: No CVA tenderness.  Skin: No rashes, bruises or suspicious lesions. Lymph: No cervical or inguinal adenopathy. Neurologic: Grossly intact, no focal deficits, moving all 4 extremities. Psychiatric: Normal mood and affect.  Laboratory Data: Lab Results  Component Value Date   WBC 13.2 (H) 12/01/2017   HGB 11.8 (L) 12/01/2017   HCT 36.6 12/01/2017   MCV 76.8 (L) 12/01/2017   PLT 342 12/01/2017    Lab Results  Component Value Date   CREATININE 0.61 12/01/2017    No results found for: PSA1  No results found for: TESTOSTERONE  No results found for: HGBA1C  Urinalysis Lab Results  Component Value Date   APPEARANCEUR CLOUDY  (A) 12/01/2017   LEUKOCYTESUR NEGATIVE 12/01/2017   PROTEINUR NEGATIVE 12/01/2017   GLUCOSEU NEGATIVE 12/01/2017   RBCU 6-30 12/01/2017   BILIRUBINUR NEGATIVE 12/01/2017   NITRITE NEGATIVE 12/01/2017    Lab Results  Component Value Date   BACTERIA RARE (A) 12/01/2017    Pertinent Imaging: CT No results found for this or any previous visit. No results found for this or any previous visit. No results found for this or any previous visit. No results found for this or any previous visit. No results found for this or any previous visit. No results found for this or any previous visit. No results found for this or any previous visit. Results for orders placed during the hospital encounter of 12/01/17  CT RENAL STONE STUDY   Narrative CLINICAL DATA:  Right lower back pain and right lower quadrant pain for 60 minutes.  EXAM: CT ABDOMEN AND PELVIS WITHOUT CONTRAST  TECHNIQUE: Multidetector CT imaging of the abdomen and pelvis was performed following the standard protocol without IV contrast.  COMPARISON:  None.  FINDINGS: Lower chest: Lung bases are clear.  Hepatobiliary: No focal liver abnormality is seen. Status post cholecystectomy. No biliary dilatation.  Pancreas: Unremarkable. No pancreatic ductal dilatation or surrounding inflammatory changes.  Spleen: Normal in size without focal abnormality.  Adrenals/Urinary Tract: No adrenal gland nodules. 4 mm stone in the distal right ureter just above the ureterovesical junction. Right hydronephrosis and hydroureter with stranding around the right kidney and ureter. Tiny punctate stone in the lower pole left kidney. No left hydronephrosis or hydroureter. Bladder is decompressed.  Stomach/Bowel: Stomach is within normal limits. Appendix appears normal. No evidence of bowel wall thickening, distention, or inflammatory changes.  Vascular/Lymphatic: No significant vascular findings are present. No enlarged abdominal or  pelvic lymph nodes.  Reproductive: Uterus and bilateral adnexa are unremarkable.  Other: No abdominal wall hernia or abnormality. No abdominopelvic ascites.  Musculoskeletal: No acute or significant osseous findings.  IMPRESSION: 4 mm stone in the distal right ureter with moderate proximal obstruction. Tiny punctate nonobstructing intrarenal stone in the left kidney.   Electronically Signed   By: Burman Nieves M.D.   On: 12/01/2017 21:57     Assessment & Plan:    1-right ureteral stone-likely passed. Will [place] to make sure hydronephrosis resolved.   2. Nephrolithiasis We discussed dietary changes to help prevent stones. Looking back she started the keto diet recently and developed gall stones, too.  - Urinalysis, Complete   No Follow-up on file.  Jerilee Field, MD  [facility] [address], [address] [place], [place] [address] [phone]

## 2019-05-19 ENCOUNTER — Telehealth: Payer: Self-pay

## 2019-05-19 NOTE — Telephone Encounter (Signed)
Pt called states that she has the urge to urinate but cant go. No other sx. No fever, back pain just going on vacation and wanted to see if she needs treatment prior to going out of town  [facility] to do labs or needs office visit.

## 2019-05-19 NOTE — Telephone Encounter (Signed)
Yes, have her come in for a urine dip.

## 2019-05-19 NOTE — Telephone Encounter (Signed)
Nurse visit made for UA and will do culture and sen if needed. Pt aware

## 2019-05-20 ENCOUNTER — Other Ambulatory Visit: Payer: Self-pay

## 2019-05-20 VITALS — BP 112/70 | HR 77 | Temp 99.6°F | Resp 12 | Ht 62.0 in | Wt 163.0 lb

## 2019-05-20 DIAGNOSIS — R8290 Unspecified abnormal findings in urine: Secondary | ICD-10-CM | POA: Insufficient documentation

## 2019-05-20 DIAGNOSIS — R34 Anuria and oliguria: Secondary | ICD-10-CM | POA: Insufficient documentation

## 2019-05-20 LAB — POCT URINALYSIS DIPSTICK
Bilirubin, UA: NEGATIVE
Blood, UA: NEGATIVE
Glucose, UA: NEGATIVE
Leukocytes, UA: NEGATIVE
Nitrite, UA: NEGATIVE
Protein, UA: NEGATIVE
Spec Grav, UA: 1.025 (ref 1.010–1.025)
Urobilinogen, UA: 0.2 E.U./dL
pH, UA: 6 (ref 5.0–8.0)

## 2019-05-20 NOTE — Addendum Note (Signed)
Addended by: Aliene Altes on: 05/20/2019 02:48 PM   Modules accepted: Orders

## 2019-05-20 NOTE — Progress Notes (Signed)
Presents C/O S/Sx since Sunday.  Was at the pool on Sunday.  Decrease frequency urinating & only voiding small amounts.  States today started having abd cramps.  Has low back pain.  Denies pelvic pain/pressure or odor to urination.  Taking Aleve.  Denies taking AZO.  Hx of Kidney stones & has had gallbladder removed.   Last LMP ended on 05/17/2019.  States drinks almost a gal of water a day.

## 2019-05-21 ENCOUNTER — Telehealth: Payer: Self-pay

## 2019-05-21 NOTE — Telephone Encounter (Signed)
Pt called about urine results, advised per MD no need for antibiotics, if her sx persist or get worse can should call the office to be seen by MD. She verbalized understanding. MD states no culture was needed from UA yesterday, pt advised.

## 2019-05-23 LAB — URINE CULTURE

## 2020-02-22 ENCOUNTER — Encounter: Payer: Self-pay | Admitting: Emergency Medicine

## 2020-02-22 ENCOUNTER — Other Ambulatory Visit: Payer: Self-pay

## 2020-02-22 ENCOUNTER — Ambulatory Visit: Payer: Self-pay | Admitting: Emergency Medicine

## 2020-02-22 VITALS — BP 123/70 | HR 73 | Temp 97.8°F | Resp 12 | Ht 62.0 in | Wt 175.0 lb

## 2020-02-22 DIAGNOSIS — M25572 Pain in left ankle and joints of left foot: Secondary | ICD-10-CM

## 2020-02-22 DIAGNOSIS — M722 Plantar fascial fibromatosis: Secondary | ICD-10-CM

## 2020-02-22 NOTE — Progress Notes (Signed)
S:  Here with left foot pain x 1 week without history of injury.  Patient describes the pain as being in her heel and also coming up into the arch of her foot.  She states it is worse first thing in the morning when she gets out of bed and improves some during the day.  She has been taking Aleve twice a day for inflammation.  She also has talked to a friend who told her about the frozen water bottle.  O: On exam of the left foot patient has a neoprene type sleeve on her foot.  On palpation plantar aspect medially is tender to palpation especially in the arch of the foot.  No gross deformities noted.  Patient is able to move digits without any difficulty.  A: Left foot plantars fasciitis  P: Patient will continue with Aleve 2 tablets twice a day and use water bottle frozen to roll her foot on each night while watching TV.  She will also obtain a pair shoes that do not bend.  We also talked about a cam walker and I showed her one on [place] that she can purchase for $45.  If not improving she can follow-up with podiatry and we can make the referral.

## 2020-02-22 NOTE — Progress Notes (Signed)
Left foot pain x1 week - heel & back of foot.  Compression sleeve.  Aleve 2 tabs 1x/day for past week.  No known injury.  Has not had happen before.  AMD

## 2020-10-31 ENCOUNTER — Encounter: Payer: Self-pay | Admitting: Podiatry

## 2020-10-31 ENCOUNTER — Ambulatory Visit (INDEPENDENT_AMBULATORY_CARE_PROVIDER_SITE_OTHER): Payer: BC Managed Care – PPO

## 2020-10-31 ENCOUNTER — Ambulatory Visit: Payer: BC Managed Care – PPO | Admitting: Podiatry

## 2020-10-31 ENCOUNTER — Other Ambulatory Visit: Payer: Self-pay

## 2020-10-31 DIAGNOSIS — M62462 Contracture of muscle, left lower leg: Secondary | ICD-10-CM

## 2020-10-31 DIAGNOSIS — M21861 Other specified acquired deformities of right lower leg: Secondary | ICD-10-CM

## 2020-10-31 DIAGNOSIS — M7662 Achilles tendinitis, left leg: Secondary | ICD-10-CM | POA: Diagnosis not present

## 2020-10-31 DIAGNOSIS — M722 Plantar fascial fibromatosis: Secondary | ICD-10-CM

## 2020-10-31 DIAGNOSIS — M216X2 Other acquired deformities of left foot: Secondary | ICD-10-CM | POA: Diagnosis not present

## 2020-10-31 DIAGNOSIS — M216X1 Other acquired deformities of right foot: Secondary | ICD-10-CM | POA: Diagnosis not present

## 2020-10-31 DIAGNOSIS — M7661 Achilles tendinitis, right leg: Secondary | ICD-10-CM | POA: Diagnosis not present

## 2020-10-31 DIAGNOSIS — M21862 Other specified acquired deformities of left lower leg: Secondary | ICD-10-CM

## 2020-10-31 DIAGNOSIS — M62461 Contracture of muscle, right lower leg: Secondary | ICD-10-CM

## 2020-10-31 MED ORDER — MELOXICAM 15 MG PO TABS: 15.0000 mg | ORAL_TABLET | Freq: Every day | ORAL | 3 refills | Status: DC

## 2020-10-31 NOTE — Progress Notes (Signed)
  Subjective:  Patient ID: Miranda Gordon, female    DOB: October 04, 1986,  MRN: [id number]  Chief Complaint  Patient presents with  . Foot Pain    Patient presents today for bilat heel/arch pain that radiated into achilles at times x 8 months. Left worse than right  She says its worse in the mornings or when standing from sitting.  She says its sharp pains and it makes popping sounds.  She has used compression socks, frozen water bottle and Aleve for relief    34 y.o. female presents with the above complaint. History confirmed with patient.  This became worse after fracturing her left patella and was in a knee immobilizer for about 3 months  Objective:  Physical Exam: warm, good capillary refill, no trophic changes or ulcerative lesions, normal DP and PT pulses and normal sensory exam.  She has severe gastrocnemius equinus bilaterally with improvement with knee flexion.  Sharp pain on palpation of the Achilles insertion bilaterally.  Pain on palpation to the plantar calcaneal insertion of the plantar fascia as well bilaterally.   Radiographs: X-ray of both feet: Very mild posterior enthesophytes, no major plantar enthesophytes Assessment:   1. Plantar fasciitis   2. Achilles tendinitis of both lower extremities   3. Gastrocnemius equinus of left lower extremity   4. Gastrocnemius equinus of right lower extremity      Plan:  Patient was evaluated and treated and all questions answered.   Discussed the etiology and treatment options for plantar fasciitis, Achilles tendinitis and gastrium equinus including stretching, formal physical therapy, supportive shoegears such as a running shoe or sneaker, pre fabricated orthoses, injection therapy, and oral medications. We also discussed the role of surgical treatment of this for patients who do not improve after exhausting non-surgical treatment options.  Her gastrocnemius equinus is profound and I think is been exacerbated by her rehab for her  patellar fracture.   -XR reviewed with patient -Educated patient on stretching and icing of the affected limb -Night splint dispensed.  Alternate use on each limb over the night -Plantar fascial braces dispensed -Rx for meloxicam. Educated on use, risks and benefits of the medication -Recommended physical therapy referral.  Referral was sent to [facility] -She preferred to avoid injections today.  If it is not better by next visit for plantar fasciitis which consider injections for this   No follow-ups on file.

## 2020-10-31 NOTE — Patient Instructions (Signed)
Call [phone] to schedule your PT appointment with [facility]    Plantar Fasciitis (Heel Spur Syndrome) with Rehab The plantar fascia is a fibrous, ligament-like, soft-tissue structure that spans the bottom of the foot. Plantar fasciitis is a condition that causes pain in the foot due to inflammation of the tissue. SYMPTOMS   Pain and tenderness on the underneath side of the foot.  Pain that worsens with standing or walking. CAUSES  Plantar fasciitis is caused by irritation and injury to the plantar fascia on the underneath side of the foot. Common mechanisms of injury include:  Direct trauma to bottom of the foot.  Damage to a small nerve that runs under the foot where the main fascia attaches to the heel bone.  Stress placed on the plantar fascia due to bone spurs. RISK INCREASES WITH:   Activities that place stress on the plantar fascia (running, jumping, pivoting, or cutting).  Poor strength and flexibility.  Improperly fitted shoes.  Tight calf muscles.  Flat feet.  Failure to warm-up properly before activity.  Obesity. PREVENTION  Warm up and stretch properly before activity.  Allow for adequate recovery between workouts.  Maintain physical fitness:  Strength, flexibility, and endurance.  Cardiovascular fitness.  Maintain a health body weight.  Avoid stress on the plantar fascia.  Wear properly fitted shoes, including arch supports for individuals who have flat feet.  PROGNOSIS  If treated properly, then the symptoms of plantar fasciitis usually resolve without surgery. However, occasionally surgery is necessary.  RELATED COMPLICATIONS   Recurrent symptoms that may result in a chronic condition.  Problems of the lower back that are caused by compensating for the injury, such as limping.  Pain or weakness of the foot during push-off following surgery.  Chronic inflammation, scarring, and partial or complete fascia tear, occurring  more often from repeated injections.  TREATMENT  Treatment initially involves the use of ice and medication to help reduce pain and inflammation. The use of strengthening and stretching exercises may help reduce pain with activity, especially stretches of the Achilles tendon. These exercises may be performed at home or with a therapist. Your caregiver may recommend that you use heel cups of arch supports to help reduce stress on the plantar fascia. Occasionally, corticosteroid injections are given to reduce inflammation. If symptoms persist for greater than 6 months despite non-surgical (conservative), then surgery may be recommended.   MEDICATION   If pain medication is necessary, then nonsteroidal anti-inflammatory medications, such as aspirin and ibuprofen, or other minor pain relievers, such as acetaminophen, are often recommended.  Do not take pain medication within 7 days before surgery.  Prescription pain relievers may be given if deemed necessary by your caregiver. Use only as directed and only as much as you need.  Corticosteroid injections may be given by your caregiver. These injections should be reserved for the most serious cases, because they may only be given a certain number of times.  HEAT AND COLD  Cold treatment (icing) relieves pain and reduces inflammation. Cold treatment should be applied for 10 to 15 minutes every 2 to 3 hours for inflammation and pain and immediately after any activity that aggravates your symptoms. Use ice packs or massage the area with a piece of ice (ice massage).  Heat treatment may be used prior to performing the stretching and strengthening activities prescribed by your caregiver, physical therapist, or athletic trainer. Use a heat pack or soak the injury in warm water.  SEEK IMMEDIATE MEDICAL  CARE IF:  Treatment seems to offer no benefit, or the condition worsens.  Any medications produce adverse side effects.  EXERCISES- RANGE OF MOTION  (ROM) AND STRETCHING EXERCISES - Plantar Fasciitis (Heel Spur Syndrome) These exercises may help you when beginning to rehabilitate your injury. Your symptoms may resolve with or without further involvement from your physician, physical therapist or athletic trainer. While completing these exercises, remember:   Restoring tissue flexibility helps normal motion to return to the joints. This allows healthier, less painful movement and activity.  An effective stretch should be held for at least 30 seconds.  A stretch should never be painful. You should only feel a gentle lengthening or release in the stretched tissue.  RANGE OF MOTION - Toe Extension, Flexion  Sit with your right / left leg crossed over your opposite knee.  Grasp your toes and gently pull them back toward the top of your foot. You should feel a stretch on the bottom of your toes and/or foot.  Hold this stretch for 10 seconds.  Now, gently pull your toes toward the bottom of your foot. You should feel a stretch on the top of your toes and or foot.  Hold this stretch for 10 seconds. Repeat  times. Complete this stretch 3 times per day.   RANGE OF MOTION - Ankle Dorsiflexion, Active Assisted  Remove shoes and sit on a chair that is preferably not on a carpeted surface.  Place right / left foot under knee. Extend your opposite leg for support.  Keeping your heel down, slide your right / left foot back toward the chair until you feel a stretch at your ankle or calf. If you do not feel a stretch, slide your bottom forward to the edge of the chair, while still keeping your heel down.  Hold this stretch for 10 seconds. Repeat 3 times. Complete this stretch 2 times per day.   STRETCH  Gastroc, Standing  Place hands on wall.  Extend right / left leg, keeping the front knee somewhat bent.  Slightly point your toes inward on your back foot.  Keeping your right / left heel on the floor and your knee straight, shift your  weight toward the wall, not allowing your back to arch.  You should feel a gentle stretch in the right / left calf. Hold this position for 10 seconds. Repeat 3 times. Complete this stretch 2 times per day.  STRETCH  Soleus, Standing  Place hands on wall.  Extend right / left leg, keeping the other knee somewhat bent.  Slightly point your toes inward on your back foot.  Keep your right / left heel on the floor, bend your back knee, and slightly shift your weight over the back leg so that you feel a gentle stretch deep in your back calf.  Hold this position for 10 seconds. Repeat 3 times. Complete this stretch 2 times per day.  STRETCH  Gastrocsoleus, Standing  Note: This exercise can place a lot of stress on your foot and ankle. Please complete this exercise only if specifically instructed by your caregiver.   Place the ball of your right / left foot on a step, keeping your other foot firmly on the same step.  Hold on to the wall or a rail for balance.  Slowly lift your other foot, allowing your body weight to press your heel down over the edge of the step.  You should feel a stretch in your right / left calf.  Hold this  position for 10 seconds.  Repeat this exercise with a slight bend in your right / left knee. Repeat 3 times. Complete this stretch 2 times per day.   STRENGTHENING EXERCISES - Plantar Fasciitis (Heel Spur Syndrome)  These exercises may help you when beginning to rehabilitate your injury. They may resolve your symptoms with or without further involvement from your physician, physical therapist or athletic trainer. While completing these exercises, remember:   Muscles can gain both the endurance and the strength needed for everyday activities through controlled exercises.  Complete these exercises as instructed by your physician, physical therapist or athletic trainer. Progress the resistance and repetitions only as guided.  STRENGTH - Towel Curls  Sit in a  chair positioned on a non-carpeted surface.  Place your foot on a towel, keeping your heel on the floor.  Pull the towel toward your heel by only curling your toes. Keep your heel on the floor. Repeat 3 times. Complete this exercise 2 times per day.  STRENGTH - Ankle Inversion  Secure one end of a rubber exercise band/tubing to a fixed object (table, pole). Loop the other end around your foot just before your toes.  Place your fists between your knees. This will focus your strengthening at your ankle.  Slowly, pull your big toe up and in, making sure the band/tubing is positioned to resist the entire motion.  Hold this position for 10 seconds.  Have your muscles resist the band/tubing as it slowl pulls your foot back to the starting position. Repeat 3 times. Complete this exercises 2 times per day.  Document Released: 10/22/2005 Document Revised: 01/14/2012 Document Reviewed: 02/03/2009 Exit[employer] Patient Information 2014 [employer], [place]   Achilles Tendinitis  with Rehab Achilles tendinitis is a disorder of the Achilles tendon. The Achilles tendon connects the large calf muscles (Gastrocnemius and Soleus) to the heel bone (calcaneus). This tendon is sometimes called the heel cord. It is important for pushing-off and standing on your toes and is important for walking, running, or jumping. Tendinitis is often caused by overuse and repetitive microtrauma. SYMPTOMS  Pain, tenderness, swelling, warmth, and redness may occur over the Achilles tendon even at rest.  Pain with pushing off, or flexing or extending the ankle.  Pain that is worsened after or during activity. CAUSES   Overuse sometimes seen with rapid increase in exercise programs or in sports requiring running and jumping.  Poor physical conditioning (strength and flexibility or endurance).  Running sports, especially training running down hills.  Inadequate warm-up before practice or play or failure to stretch before  participation.  Injury to the tendon. PREVENTION   Warm up and stretch before practice or competition.  Allow time for adequate rest and recovery between practices and competition.  Keep up conditioning.  Keep up ankle and leg flexibility.  Improve or keep muscle strength and endurance.  Improve cardiovascular fitness.  Use proper technique.  Use proper equipment (shoes, skates).  To help prevent recurrence, taping, protective strapping, or an adhesive bandage may be recommended for several weeks after healing is complete. PROGNOSIS   Recovery may take weeks to several months to heal.  Longer recovery is expected if symptoms have been prolonged.  Recovery is usually quicker if the inflammation is due to a direct blow as compared with overuse or sudden strain. RELATED COMPLICATIONS   Healing time will be prolonged if the condition is not correctly treated. The injury must be given plenty of time to heal.  Symptoms can reoccur if activity is  resumed too soon.  Untreated, tendinitis may increase the risk of tendon rupture requiring additional time for recovery and possibly surgery. TREATMENT   The first treatment consists of rest anti-inflammatory medication, and ice to relieve the pain.  Stretching and strengthening exercises after resolution of pain will likely help reduce the risk of recurrence. Referral to a physical therapist or athletic trainer for further evaluation and treatment may be helpful.  A walking boot or cast may be recommended to rest the Achilles tendon. This can help break the cycle of inflammation and microtrauma.  Arch supports (orthotics) may be prescribed or recommended by your caregiver as an adjunct to therapy and rest.  Surgery to remove the inflamed tendon lining or degenerated tendon tissue is rarely necessary and has shown less than predictable results. MEDICATION   Nonsteroidal anti-inflammatory medications, such as aspirin and ibuprofen,  may be used for pain and inflammation relief. Do not take within 7 days before surgery. Take these as directed by your caregiver. Contact your caregiver immediately if any bleeding, stomach upset, or signs of allergic reaction occur. Other minor pain relievers, such as acetaminophen, may also be used.  Pain relievers may be prescribed as necessary by your caregiver. Do not take prescription pain medication for longer than 4 to 7 days. Use only as directed and only as much as you need.  Cortisone injections are rarely indicated. Cortisone injections may weaken tendons and predispose to rupture. It is better to give the condition more time to heal than to use them. HEAT AND COLD  Cold is used to relieve pain and reduce inflammation for acute and chronic Achilles tendinitis. Cold should be applied for 10 to 15 minutes every 2 to 3 hours for inflammation and pain and immediately after any activity that aggravates your symptoms. Use ice packs or an ice massage.  Heat may be used before performing stretching and strengthening activities prescribed by your caregiver. Use a heat pack or a warm soak. SEEK MEDICAL CARE IF:  Symptoms get worse or do not improve in 2 weeks despite treatment.  New, unexplained symptoms develop. Drugs used in treatment may produce side effects.  EXERCISES:  RANGE OF MOTION (ROM) AND STRETCHING EXERCISES - Achilles Tendinitis  These exercises may help you when beginning to rehabilitate your injury. Your symptoms may resolve with or without further involvement from your physician, physical therapist or athletic trainer. While completing these exercises, remember:   Restoring tissue flexibility helps normal motion to return to the joints. This allows healthier, less painful movement and activity.  An effective stretch should be held for at least 30 seconds.  A stretch should never be painful. You should only feel a gentle lengthening or release in the stretched  tissue.  STRETCH  Gastroc, Standing   Place hands on wall.  Extend right / left leg, keeping the front knee somewhat bent.  Slightly point your toes inward on your back foot.  Keeping your right / left heel on the floor and your knee straight, shift your weight toward the wall, not allowing your back to arch.  You should feel a gentle stretch in the right / left calf. Hold this position for 10 seconds. Repeat 3 times. Complete this stretch 2 times per day.  STRETCH  Soleus, Standing   Place hands on wall.  Extend right / left leg, keeping the other knee somewhat bent.  Slightly point your toes inward on your back foot.  Keep your right / left heel on the floor,  bend your back knee, and slightly shift your weight over the back leg so that you feel a gentle stretch deep in your back calf.  Hold this position for 10 seconds. Repeat 3 times. Complete this stretch 2 times per day.  STRETCH  Gastrocsoleus, Standing  Note: This exercise can place a lot of stress on your foot and ankle. Please complete this exercise only if specifically instructed by your caregiver.   Place the ball of your right / left foot on a step, keeping your other foot firmly on the same step.  Hold on to the wall or a rail for balance.  Slowly lift your other foot, allowing your body weight to press your heel down over the edge of the step.  You should feel a stretch in your right / left calf.  Hold this position for 10 seconds.  Repeat this exercise with a slight bend in your knee. Repeat 3 times. Complete this stretch 2 times per day.   STRENGTHENING EXERCISES - Achilles Tendinitis These exercises may help you when beginning to rehabilitate your injury. They may resolve your symptoms with or without further involvement from your physician, physical therapist or athletic trainer. While completing these exercises, remember:   Muscles can gain both the endurance and the strength needed for everyday  activities through controlled exercises.  Complete these exercises as instructed by your physician, physical therapist or athletic trainer. Progress the resistance and repetitions only as guided.  You may experience muscle soreness or fatigue, but the pain or discomfort you are trying to eliminate should never worsen during these exercises. If this pain does worsen, stop and make certain you are following the directions exactly. If the pain is still present after adjustments, discontinue the exercise until you can discuss the trouble with your clinician.  STRENGTH - Plantar-flexors   Sit with your right / left leg extended. Holding onto both ends of a rubber exercise band/tubing, loop it around the ball of your foot. Keep a slight tension in the band.  Slowly push your toes away from you, pointing them downward.  Hold this position for 10 seconds. Return slowly, controlling the tension in the band/tubing. Repeat 3 times. Complete this exercise 2 times per day.   STRENGTH - Plantar-flexors   Stand with your feet shoulder width apart. Steady yourself with a wall or table using as little support as needed.  Keeping your weight evenly spread over the width of your feet, rise up on your toes.*  Hold this position for 10 seconds. Repeat 3 times. Complete this exercise 2 times per day.  *If this is too easy, shift your weight toward your right / left leg until you feel challenged. Ultimately, you may be asked to do this exercise with your right / left foot only.  STRENGTH  Plantar-flexors, Eccentric  Note: This exercise can place a lot of stress on your foot and ankle. Please complete this exercise only if specifically instructed by your caregiver.   Place the balls of your feet on a step. With your hands, use only enough support from a wall or rail to keep your balance.  Keep your knees straight and rise up on your toes.  Slowly shift your weight entirely to your right / left toes and pick  up your opposite foot. Gently and with controlled movement, lower your weight through your right / left foot so that your heel drops below the level of the step. You will feel a slight stretch in the  back of your calf at the end position.  Use the healthy leg to help rise up onto the balls of both feet, then lower weight only on the right / left leg again. Build up to 15 repetitions. Then progress to 3 consecutive sets of 15 repetitions.*  After completing the above exercise, complete the same exercise with a slight knee bend (about 30 degrees). Again, build up to 15 repetitions. Then progress to 3 consecutive sets of 15 repetitions.* Perform this exercise 2 times per day.  *When you easily complete 3 sets of 15, your physician, physical therapist or athletic trainer may advise you to add resistance by wearing a backpack filled with additional weight.  STRENGTH - Plantar Flexors, Seated   Sit on a chair that allows your feet to rest flat on the ground. If necessary, sit at the edge of the chair.  Keeping your toes firmly on the ground, lift your right / left heel as far as you can without increasing any discomfort in your ankle. Repeat 3 times. Complete this exercise 2 times a day.

## 2020-12-02 ENCOUNTER — Other Ambulatory Visit: Payer: Self-pay

## 2020-12-02 ENCOUNTER — Encounter: Payer: Self-pay | Admitting: Obstetrics

## 2020-12-02 ENCOUNTER — Ambulatory Visit (INDEPENDENT_AMBULATORY_CARE_PROVIDER_SITE_OTHER): Payer: BC Managed Care – PPO | Admitting: Obstetrics

## 2020-12-02 VITALS — BP 130/80 | Ht 62.0 in | Wt 180.0 lb

## 2020-12-02 DIAGNOSIS — N898 Other specified noninflammatory disorders of vagina: Secondary | ICD-10-CM | POA: Diagnosis not present

## 2020-12-02 DIAGNOSIS — R300 Dysuria: Secondary | ICD-10-CM | POA: Diagnosis not present

## 2020-12-02 LAB — POCT WET PREP (WET MOUNT)
Clue Cells Wet Prep Whiff POC: NEGATIVE
Trichomonas Wet Prep HPF POC: ABSENT

## 2020-12-02 LAB — POCT URINALYSIS DIPSTICK
Blood, UA: NEGATIVE
Glucose, UA: NEGATIVE
Leukocytes, UA: NEGATIVE
Protein, UA: NEGATIVE

## 2020-12-02 NOTE — Progress Notes (Signed)
Obstetrics & Gynecology Office Visit   Chief Complaint:  Chief Complaint  Patient presents with  . Urinary Tract Infection    Burning and frequency urinating x 1 week  . Groin Swelling    No excessive vag discharge, odor, or itchiness x 3 days    History of Present Illness: Miranda Gordon presents today with a complanit of vaginal irritation and request for evaluation for a possible UTI. She is accompanied by her daughter. Miranda Gordon reports that two days ago she noticed some internal irritation after intercourse. She denies any vaginal discharge, or vaginal bleeding. Her LMP{ was1/01/2021. She contracepts with a vasectomy, and does not take any hormaonal medications. She admits that her last annual Gyn Physical/pap smear was at least five years ago, and that she is over due for a GYN annual.  She shaves and waxes her entire mons and perineal area.No recent changes in diet or sexual partners. She is married for over a decade to the same man.   Review of Systems:  Review of Systems  Constitutional: Negative.   HENT: Negative.   Eyes: Negative.   Cardiovascular: Negative.   Gastrointestinal: Negative.   Genitourinary: Negative.   Skin: Negative.        Some irritation along her labia and reports vaginal irritation.  Neurological: Negative.      Past Medical History:  Past Medical History:  Diagnosis Date  . Anemia   . Anxiety   . Asthma   . Cholelithiasis   . Depression   . Dysrhythmia    "extra beat" per pt -asymptomatic  . GERD (gastroesophageal reflux disease)   . Headache     Past Surgical History:  Past Surgical History:  Procedure Laterality Date  . CHOLECYSTECTOMY N/A 07/14/2015   Procedure: LAPAROSCOPIC CHOLECYSTECTOMY;  Surgeon: Natale Lay, MD;  Location: [facility] ORS;  Service: General;  Laterality: N/A;  . MANDIBLE SURGERY    . TONSILLECTOMY      Gynecologic History: Patient's last menstrual period was 11/07/2020 (exact date).  Obstetric History: [id number]  Family  History:  Family History  Problem Relation Age of Onset  . Pancreatic cancer Father   . Colon cancer Maternal Grandmother   . Colon cancer Maternal Grandfather   . Breast cancer Paternal Grandmother 52  . Colon cancer Paternal Grandmother     Social History:  Social History   Socioeconomic History  . Marital status: Married    Spouse name: Not on file  . Number of children: Not on file  . Years of education: Not on file  . Highest education level: Not on file  Occupational History  . Not on file  Tobacco Use  . Smoking status: Never Smoker  . Smokeless tobacco: Never Used  Vaping Use  . Vaping Use: Never used  Substance and Sexual Activity  . Alcohol use: No  . Drug use: No  . Sexual activity: Yes    Birth control/protection: None, Surgical    Comment: Vasectomy  Other Topics Concern  . Not on file  Social History Narrative  . Not on file   Social Determinants of Health   Financial Resource Strain: Not on file  Food Insecurity: Not on file  Transportation Needs: Not on file  Physical Activity: Not on file  Stress: Not on file  Social Connections: Not on file  Intimate Partner Violence: Not on file    Allergies:  No Known Allergies  Medications: Prior to Admission medications   Medication Sig Start Date End Date Taking?  Authorizing Provider  linaclotide (LINZESS) 72 MCG capsule Take 72 mcg by mouth daily before breakfast.   Yes [provider]  meloxicam (MOBIC) 15 MG tablet Take 1 tablet (15 mg total) by mouth daily. 10/31/20  Yes Edwin Cap, DPM    Physical Exam Vitals:  Vitals:   12/02/20 1414  BP: 130/80   Patient's last menstrual period was 11/07/2020 (exact date).  Physical Exam Constitutional:      Appearance: Normal appearance. She is obese.  HENT:     Head: Atraumatic.     Nose: Nose normal.  Cardiovascular:     Rate and Rhythm: Regular rhythm.     Heart sounds: Normal heart sounds.  Pulmonary:     Effort: Pulmonary  effort is normal.     Breath sounds: Normal breath sounds.  Abdominal:     Palpations: Abdomen is soft.  Genitourinary:    Comments: Shaves entire escutcheon. Numerous small scabs from shaving noted. One hard 1 cm round under the surface ? "boil" noted at 12:00 at the top of her labia.   Small skin tags noted outside her left labia in the groin area. Normal vaginal mucosa, no obvious discharge. Saline wet prep shows neg clue cells, negative yeast or hyphae, absence of normal flora. Musculoskeletal:        General: Normal range of motion.     Cervical back: Normal range of motion and neck supple.  Skin:    General: Skin is dry.  Neurological:     Mental Status: She is alert and oriented to person, place, and time.    Urine dip - negative for glucose, hematuria or leukocytes or nitrites.  Assessment: 35 y.o. [id number] No problem-specific Assessment & Plan notes found for this encounter.   Plan: Problem List Items Addressed This Visit   None   Visit Diagnoses    Dysuria    -  Primary   Relevant Orders   POCT Urinalysis Dipstick (Completed)   Vaginal irritation       Relevant Orders   POCT Wet Prep [facility])   Vaginal discharge       Relevant Orders   POCT Wet Prep [facility])    As she does not show sxs of yeast vaginitis, nor BV, not UTI, advised her to consider a change from waxing and shaving.  She is reassured that there is no clear diagnosis today.Discussed the use of vaginal lubricants for Intercourse. Suggested she trial some probiotics to increase healthy GI and vaginal flora. Encouraged her to make an appointment for a pap smear soon as she is over due.  [name], CNM  12/02/2020 4:28 PM

## 2020-12-06 ENCOUNTER — Ambulatory Visit: Payer: BC Managed Care – PPO | Attending: Podiatry

## 2020-12-06 ENCOUNTER — Other Ambulatory Visit: Payer: Self-pay

## 2020-12-06 DIAGNOSIS — R262 Difficulty in walking, not elsewhere classified: Secondary | ICD-10-CM | POA: Insufficient documentation

## 2020-12-06 DIAGNOSIS — M79672 Pain in left foot: Secondary | ICD-10-CM | POA: Diagnosis present

## 2020-12-06 DIAGNOSIS — M79671 Pain in right foot: Secondary | ICD-10-CM | POA: Insufficient documentation

## 2020-12-06 NOTE — Patient Instructions (Signed)
Access Code: [email or web address] URL: [email or web address] Date: 12/06/2020 Prepared by: Loralyn Freshwater  Exercises Forward Backward Weight Shift with Counter Support - 1 x daily - 7 x weekly - 1 sets - 5 reps - 30 seconds hold Standing Gastroc Stretch - 3 x daily - 7 x weekly - 1 sets - 5 reps - 30 hold Standing Soleus Stretch - 3 x daily - 7 x weekly - 1 sets - 5 reps - 30 seconds hold

## 2020-12-06 NOTE — Therapy (Signed)
[place] [facility] PHYS[facility] AND SPORTS MEDICINE [address], [place], [address] Phone: [phone]   Fax:  [phone]  Physical Therapy Evaluation  Patient Details  Name: Miranda Gordon MRN: [id number] Date of Birth: 1985-12-24 Referring Provider (PT): Edwin Cap, [place]   Encounter Date: 12/06/2020   PT End of Session - 12/06/20 1735    Visit Number 1    Number of Visits 17    Date for PT Re-Evaluation 02/02/21    PT Start Time 1736    PT Stop Time 1837    PT Time Calculation (min) 61 min    Activity Tolerance Patient tolerated treatment well    Behavior During Therapy [facility] for tasks assessed/performed           Past Medical History:  Diagnosis Date  . Anemia   . Anxiety   . Asthma   . Cholelithiasis   . Depression   . Dysrhythmia    "extra beat" per pt -asymptomatic  . GERD (gastroesophageal reflux disease)   . Headache     Past Surgical History:  Procedure Laterality Date  . CHOLECYSTECTOMY N/A 07/14/2015   Procedure: LAPAROSCOPIC CHOLECYSTECTOMY;  Surgeon: Natale Lay, MD;  Location: [facility] ORS;  Service: General;  Laterality: N/A;  . MANDIBLE SURGERY    . TONSILLECTOMY      There were no vitals filed for this visit.    Subjective Assessment - 12/06/20 1738    Subjective L foot: 1/10 currently (both heel and arch), 8/10 at most for the past 3 months (when pt first wakes up in the morning or walking a lot, about over an hour). R foot: 0/10 currently, 8/10 at most for the past 3 months.    Pertinent History B plantar fasciitis, Achilles tendinitis, gastrocnemius equinus. L LE bothers her more. Pt broke her L patella last April 2021 (9 months ago). Pt was just walking and felt electricity went down her entire L LE and pt fell, belly flopped onto the cement.  Does not know if it healed. Did not have surgery. Sometimes has L knee pain when going up and down stairs (down stairs is worse). Feels like balance is way off. Was in the  knee immobilizer for 6 weeks without taking it off and was not really allowed to bear weight onto L LE. Pt was sitting most of the time. Most of her pain is on the arch and heels. Both calves are extremely tight. Has not had PT for her patella fx and feet. The plantar arch hurts the most L foot and the heel hurts the most R foot. Pt does not run since her patella injury. Pt slept with night splints for her calf muscles. Low back bothers her as well more than normal. Has not had back problems before her fall in April 2021.    Patient Stated Goals Improve function.    Currently in Pain? Yes    Pain Score 1     Pain Location Foot    Pain Orientation Left    Pain Descriptors / Indicators Sharp;Aching;Tightness    Pain Type Chronic pain    Pain Onset More than a month ago    Pain Frequency Occasional    Aggravating Factors  when pt first wakes up in the morning or walking a lot, about over an hour.    Pain Relieving Factors sitting and resting, soaking them in warm water.  [facility] PT Assessment - 12/06/20 1750      Assessment   Medical Diagnosis Plantar fasciitis, B Achilles tendinitis, B gastrocnemius equinus    Referring Provider (PT) Edwin Cap, DPM    Onset Date/Surgical Date 10/31/20    Prior Therapy none      Precautions   Precaution Comments hx of L patellar fx      Posture/Postural Control   Posture Comments R hip in ER      AROM   Right Ankle Dorsiflexion -9    Left Ankle Dorsiflexion -5      Strength   Right Hip Flexion 4/5    Right Hip Extension 3+/5    Right Hip ABduction 4+/5    Left Hip Flexion 4-/5    Left Hip Extension 4-/5    Left Hip ABduction 4/5    Right Knee Flexion 4+/5    Right Knee Extension 5/5    Left Knee Flexion 4/5    Left Knee Extension 4/5    Right Ankle Dorsiflexion 4/5    Right Ankle Plantar Flexion 4+/5   seated manually resisted   Left Ankle Dorsiflexion 4/5    Left Ankle Plantar Flexion 4/5   seated manually resisted; L  knee and calf pain     Palpation   Palpation comment TTP R and L  heel, L achilles tendon. Decreased fascial mobility Plantar feet      Special Tests   Other special tests (-) calf squeeze test L      Ambulation/Gait   Gait Comments antalgic, decreased stance L LE                      Objective measurements completed on examination: See above findings.          No latex allergies  Medbridge  Access Code [email or web address]  Manual therapy   Supine STM plantar fascia area B and heels Supine transverse friction massage L achilles     Therapeutic Exercise  Standing forward weight shift for L plantar flexion isometrics 30 seconds 5x  Standing gastroc stretch 30 seconds each LE  Standing soleus stretch 30 seconds each LE  Reviewed and given aforementioned exercises as part of HEP. Pt demonstrated and verbalized understanding. Handout provided.   Improved exercise technique, movement at target joints, use of target muscles after mod verbal, visual, tactile cues.   Response to treatment Decreased pain B feet after session during gait   Clinical impression  Pt is a 35 year old female who came to physical therapy secondary to bilateral plantar fascia, heel, and L achilles pain. She also presents with L knee pain from her patella fracture about 9 months ago, altered gait pattern, bilateral hip and knee weakness, decreased ankle DF ROM, TTP bilateral feet at affected areas, decrease fascial mobility bilateral plantar feet and heels, and difficulty performing tasks which involve ambulation. Pt will benefit from skilled physical therapy services to address the aforementioned deficits.       PT Education - 12/06/20 1851    Education Details ther-ex, HEP, plan of care    Person(s) Educated Patient    Methods Explanation;Demonstration;Tactile cues;Verbal cues;Handout    Comprehension Returned demonstration;Verbalized understanding            PT Short Term Goals -  12/06/20 1855      PT SHORT TERM GOAL #1   Title Pt will be independent with her initial HEP to decrease pain, improve function.    Baseline Pt  has started her HEP (12/06/2020)    Time 3    Period Weeks    Status New    Target Date 12/29/20             PT Long Term Goals - 12/06/20 1856      PT LONG TERM GOAL #1   Title Patient will have a decrease in L and R foot pain to 3/10 or less at worst to promote ability to ambulate as well as perform closed chain tasks more comfortably.    Baseline 8/10 R and L foot pain at most for the past 3 months (12/06/2020)    Time 8    Period Weeks    Status New    Target Date 02/02/21      PT LONG TERM GOAL #2   Title Patient will improve R and L ankle DF AROM by at least 10 degrees to promote ability to ambulate more comfortably.    Baseline Ankle DF AROM: R -9 degrees, L -5 degrees (12/06/2020)    Time 8    Period Weeks    Status New    Target Date 02/02/21      PT LONG TERM GOAL #3   Title Patient will improve bilateral hip extension and abduction strength by at least 1/2 MMT grade to promote ability to perform standing tasks more comfortably.    Time 8    Period Weeks    Status New    Target Date 02/02/21      PT LONG TERM GOAL #4   Title Pt will improve her foot FOTO score by at least 10 points as a demonstration of improve function.    Baseline Foot FOTO 47 (12/06/2020)    Time 8    Period Weeks    Status New    Target Date 02/02/21                  Plan - 12/06/20 1851    Clinical Impression Statement Pt is a 35 year old female who came to physical therapy secondary to bilateral plantar fascia, heel, and L achilles pain. She also presents with L knee pain from her patella fracture about 9 months ago, altered gait pattern, bilateral hip and knee weakness, decreased ankle DF ROM, TTP bilateral feet at affected areas, decrease fascial mobility bilateral plantar feet and heels, and difficulty performing tasks which involve  ambulation. Pt will benefit from skilled physical therapy services to address the aforementioned deficits.    Personal Factors and Comorbidities Comorbidity 2;Time since onset of injury/illness/exacerbation    Comorbidities depression, L patellar fracture    Examination-Activity Limitations Squat;Locomotion Level;Stairs    Stability/Clinical Decision Making Stable/Uncomplicated    Clinical Decision Making Low    Rehab Potential Fair    PT Frequency 2x / week    PT Duration 8 weeks    PT Treatment/Interventions Neuromuscular re-education;Therapeutic activities;Therapeutic exercise;Patient/family education;Manual techniques;Dry needling;Aquatic Therapy;Electrical Stimulation;Iontophoresis 4mg /ml Dexamethasone;Ultrasound;Gait training    PT Next Visit Plan hip, knee, ankle strengthening, ROM, manual techniques, modalities PRN    PT Home Exercise Plan Medbridge  Access Code [email or web address]    Consulted and Agree with Plan of Care Patient           Patient will benefit from skilled therapeutic intervention in order to improve the following deficits and impairments:  Pain,Postural dysfunction,Improper body mechanics,Increased fascial restricitons,Difficulty walking,Decreased strength,Decreased range of motion  Visit Diagnosis: Pain in left foot - Plan: PT plan of care cert/re-cert  Pain in  right foot - Plan: PT plan of care cert/re-cert  Difficulty in walking, not elsewhere classified - Plan: PT plan of care cert/re-cert     Problem List Patient Active Problem List   Diagnosis Date Noted  . Urine output low 05/20/2019  . Urine finding 05/20/2019  . Recurrent biliary colic 06/29/2015    Loralyn Freshwater PT, DPT   12/06/2020, 7:10 PM  [place] [facility] PHYSICAL AND SPORTS MEDICINE [address] [place], [address] Phone: [phone]   Fax:  [phone]  Name: Miranda Gordon MRN: [id number] Date of Birth: May 15, 1986

## 2020-12-08 ENCOUNTER — Ambulatory Visit: Payer: BC Managed Care – PPO

## 2020-12-12 ENCOUNTER — Ambulatory Visit: Payer: BC Managed Care – PPO | Admitting: Podiatry

## 2020-12-13 ENCOUNTER — Ambulatory Visit: Payer: BC Managed Care – PPO | Admitting: Obstetrics

## 2020-12-13 ENCOUNTER — Ambulatory Visit: Payer: BC Managed Care – PPO

## 2020-12-20 ENCOUNTER — Ambulatory Visit: Payer: BC Managed Care – PPO

## 2020-12-20 ENCOUNTER — Telehealth: Payer: Self-pay

## 2020-12-20 NOTE — Telephone Encounter (Signed)
No show. Called patient and left a message pertaining to appointment and a reminder for the next follow up session. Return phone call requested. Phone number ([phone]) provided.   

## 2020-12-22 ENCOUNTER — Ambulatory Visit: Payer: BC Managed Care – PPO

## 2020-12-26 ENCOUNTER — Telehealth: Payer: Self-pay

## 2020-12-26 NOTE — Telephone Encounter (Signed)
No show for previous session. Called patient and left a message at her phone pertaining to the previous appointments as well as a reminder for her next session. Also requested for pt to let us know if she would like to remain on the schedule. Return phone call requested. Phone number [phone] provided.

## 2020-12-27 ENCOUNTER — Ambulatory Visit: Payer: BC Managed Care – PPO

## 2022-03-05 ENCOUNTER — Ambulatory Visit: Payer: BC Managed Care – PPO | Admitting: Podiatry

## 2022-03-05 DIAGNOSIS — B070 Plantar wart: Secondary | ICD-10-CM | POA: Diagnosis not present

## 2022-03-05 DIAGNOSIS — M722 Plantar fascial fibromatosis: Secondary | ICD-10-CM

## 2022-03-06 NOTE — Progress Notes (Signed)
?  Subjective:  ?Patient ID: Miranda Gordon, female    DOB: 1986-03-25,  MRN: [id number] ? ?Chief Complaint  ?Patient presents with  ? Plantar Warts  ?   problems with plantar fasciitis and possible wart on foot also  ? ? ?36 y.o. female presents with the above complaint. History confirmed with patient.  She returns today with a new issue with a possible wart on the right foot, her heels are feeling okay ? ?Objective:  ?Physical Exam: ?warm, good capillary refill, no trophic changes or ulcerative lesions, normal DP and PT pulses, and normal sensory exam. ?Left Foot: normal exam, no swelling, tenderness, instability; ligaments intact, full range of motion of all ankle/foot joints ?Right Foot:  Submetatarsal 2 verruca plantaris noted ? ?Assessment:  ? ?1. Verruca plantaris   ? ? ? ?Plan:  ?Patient was evaluated and treated and all questions answered. ? ?Discussed etiology and treatment of verruca plantaris in detail with the patient as well as multiple treatment options including blistering agents, chemotherapeutic agents, surgical excision, laser therapy and the indications and roles of the above.  Today, recommended treatment with Cantharone as noted in procedure note below.  She would prefer to wait for the [facility] application due to the blistering until she is off for summer break.  She will follow-up with me in June for this.  Today salinocaine ointment was applied as noted below.  I recommended a picture pads and these were dispensed as well.  She will return in 1 month.   ?Procedure: Destruction of Lesion ?Location: Right foot submetatarsal 2 ?Instrumentation: 15 blade. ?Technique: Debridement of lesion to petechial bleeding. Aperture pad applied around lesion. Small amount of salinocaine applied to the base of the lesion. ?Dressing: Dry, sterile, compression dressing. ?Disposition: Patient tolerated procedure well. Advised to leave dressing on for 6-8 hours. Thereafter patient to wash the area with soap and  water and applied band-aid. Off-loading pads dispensed.  ? ?Return in about 30 days (around 04/04/2022) for wart treatment.  ? ?

## 2022-04-04 ENCOUNTER — Ambulatory Visit (INDEPENDENT_AMBULATORY_CARE_PROVIDER_SITE_OTHER): Payer: Self-pay | Admitting: Podiatry

## 2022-04-04 DIAGNOSIS — Z91199 Patient's noncompliance with other medical treatment and regimen due to unspecified reason: Secondary | ICD-10-CM

## 2022-04-04 NOTE — Progress Notes (Signed)
Patient was no-show for appointment today 

## 2022-08-06 ENCOUNTER — Encounter: Payer: Self-pay | Admitting: Podiatry

## 2022-08-06 ENCOUNTER — Ambulatory Visit: Payer: BC Managed Care – PPO | Admitting: Podiatry

## 2022-08-06 DIAGNOSIS — B070 Plantar wart: Secondary | ICD-10-CM

## 2022-08-06 MED ORDER — MELOXICAM 15 MG PO TABS: 15.0000 mg | ORAL_TABLET | Freq: Every day | ORAL | 3 refills | Status: AC

## 2022-08-06 NOTE — Patient Instructions (Signed)
Take dressing off in 8 hours and wash the foot with soap and water. If it is hurting or becomes uncomfortable before the 8 hours, go ahead and remove the bandage and wash the area.  If it blisters, apply antibiotic ointment and a band-aid.  Monitor for any signs/symptoms of infection. Call the office immediately if any occur or go directly to the emergency room. Call with any questions/concerns.   

## 2022-08-09 NOTE — Progress Notes (Signed)
  Subjective:  Patient ID: Miranda Gordon, female    DOB: August 11, 1986,  MRN: [id number]  Chief Complaint  Patient presents with   Plantar Warts    "It hurts."    36 y.o. female presents with the above complaint. History confirmed with patient.  She had peeling and improvement in the wart after the last visit but she did not return for her follow-up.  He said is gotten much worse since then has grown again.  Objective:  Physical Exam: warm, good capillary refill, no trophic changes or ulcerative lesions, normal DP and PT pulses, and normal sensory exam. Left Foot: normal exam, no swelling, tenderness, instability; ligaments intact, full range of motion of all ankle/foot joints Right Foot:  Submetatarsal 2 verruca plantaris noted, much larger and in a mosaic pattern multiple lesions  Assessment:   1. Verruca plantaris      Plan:  Patient was evaluated and treated and all questions answered.  Discussed etiology and treatment of verruca plantaris in detail with the patient as well as multiple treatment options including blistering agents, chemotherapeutic agents, surgical excision, laser therapy and the indications and roles of the above.  Today, recommended treatment with Cantharone as noted in procedure note below.  Also recommended compounded wart cream this was sent to [facility] drug pharmacy.  Rx for meloxicam sent as well.   Procedure: Destruction of Lesion Location: Right foot submetatarsal 2 Instrumentation: 15 blade. Technique: Debridement of lesion to petechial bleeding. Aperture pad applied around lesion. Small amount of Cantharone applied to the base of the lesion. Dressing: Dry, sterile, compression dressing. Disposition: Patient tolerated procedure well. Advised to leave dressing on for 6-8 hours. Thereafter patient to wash the area with soap and water and applied band-aid. Off-loading pads dispensed.   Return in about 3 weeks (around 08/27/2022) for wart treatment.

## 2022-08-29 ENCOUNTER — Ambulatory Visit: Payer: BC Managed Care – PPO | Admitting: Podiatry

## 2024-12-11 ENCOUNTER — Ambulatory Visit: Payer: Self-pay | Admitting: Family Medicine
